# Patient Record
Sex: Male | Born: 1975 | Race: Black or African American | Hispanic: No | Smoking: Current every day smoker
Health system: Southern US, Community
[De-identification: ages and names within clinical notes are randomized; demographics above are authoritative.]

---

## 2014-01-18 ENCOUNTER — Encounter (HOSPITAL_COMMUNITY): Payer: Self-pay | Admitting: Emergency Medicine

## 2014-01-18 ENCOUNTER — Emergency Department (HOSPITAL_COMMUNITY)
Admission: EM | Admit: 2014-01-18 | Discharge: 2014-01-18 | Disposition: A | Discharge status: Home / Self Care | Payer: Self-pay | Attending: Emergency Medicine | Admitting: Emergency Medicine

## 2014-01-18 DIAGNOSIS — F17200 Nicotine dependence, unspecified, uncomplicated: Secondary | ICD-10-CM | POA: Insufficient documentation

## 2014-01-18 DIAGNOSIS — L03116 Cellulitis of left lower limb: Secondary | ICD-10-CM

## 2014-01-18 DIAGNOSIS — L03119 Cellulitis of unspecified part of limb: Principal | ICD-10-CM

## 2014-01-18 DIAGNOSIS — R21 Rash and other nonspecific skin eruption: Secondary | ICD-10-CM | POA: Insufficient documentation

## 2014-01-18 DIAGNOSIS — L02419 Cutaneous abscess of limb, unspecified: Secondary | ICD-10-CM | POA: Insufficient documentation

## 2014-01-18 DIAGNOSIS — L02416 Cutaneous abscess of left lower limb: Secondary | ICD-10-CM

## 2014-01-18 LAB — COMPREHENSIVE METABOLIC PANEL
ALK PHOS: 92 U/L (ref 39–117)
ALT: 19 U/L (ref 0–53)
ANION GAP: 11 (ref 5–15)
AST: 33 U/L (ref 0–37)
Albumin: 3.5 g/dL (ref 3.5–5.2)
BILIRUBIN TOTAL: 0.4 mg/dL (ref 0.3–1.2)
BUN: 11 mg/dL (ref 6–23)
CHLORIDE: 98 meq/L (ref 96–112)
CO2: 27 mEq/L (ref 19–32)
CREATININE: 0.75 mg/dL (ref 0.50–1.35)
Calcium: 9.2 mg/dL (ref 8.4–10.5)
GFR calc Af Amer: >90 mL/min (ref >90)
GFR calc non Af Amer: >90 mL/min (ref >90)
Glucose, Bld: 112 mg/dL — ABNORMAL HIGH (ref 70–99)
POTASSIUM: 5.2 meq/L (ref 3.7–5.3)
Sodium: 136 mEq/L — ABNORMAL LOW (ref 137–147)
Total Protein: 8.2 g/dL (ref 6.0–8.3)

## 2014-01-18 LAB — CBC WITH DIFFERENTIAL/PLATELET
BASOS ABS: 0 10*3/uL (ref 0.0–0.1)
Basophils Relative: 0 % (ref 0–1)
EOS ABS: 0 10*3/uL (ref 0.0–0.7)
Eosinophils Relative: 0 % (ref 0–5)
HCT: 42.7 % (ref 39.0–52.0)
Hemoglobin: 14.8 g/dL (ref 13.0–17.0)
Lymphocytes Relative: 33 % (ref 12–46)
Lymphs Abs: 2 10*3/uL (ref 0.7–4.0)
MCH: 30.2 pg (ref 26.0–34.0)
MCHC: 34.7 g/dL (ref 30.0–36.0)
MCV: 87.1 fL (ref 78.0–100.0)
Monocytes Absolute: 0.6 10*3/uL (ref 0.1–1.0)
Monocytes Relative: 10 % (ref 3–12)
NEUTROS ABS: 3.5 10*3/uL (ref 1.7–7.7)
Neutrophils Relative %: 57 % (ref 43–77)
PLATELETS: 325 10*3/uL (ref 150–400)
RBC: 4.9 MIL/uL (ref 4.22–5.81)
RDW: 13.1 % (ref 11.5–15.5)
WBC: 6.2 10*3/uL (ref 4.0–10.5)

## 2014-01-18 MED ORDER — OXYCODONE-ACETAMINOPHEN 5-325 MG PO TABS: 1.0000 | ORAL_TABLET | Freq: Four times a day (QID) | ORAL | Status: DC | PRN

## 2014-01-18 MED ORDER — LIDOCAINE-EPINEPHRINE 2 %-1:100000 IJ SOLN
10.0000 mL | Freq: Once | INTRAMUSCULAR | Status: DC
  Filled 2014-01-18: qty 10

## 2014-01-18 MED ORDER — CLINDAMYCIN PHOSPHATE 600 MG/50ML IV SOLN
600.0000 mg | Freq: Once | INTRAVENOUS | Status: AC
  Administered 2014-01-18: 600 mg via INTRAVENOUS
  Filled 2014-01-18: qty 50

## 2014-01-18 MED ORDER — MORPHINE SULFATE 4 MG/ML IJ SOLN
4.0000 mg | Freq: Once | INTRAMUSCULAR | Status: AC
  Administered 2014-01-18: 4 mg via INTRAVENOUS
  Filled 2014-01-18: qty 1

## 2014-01-18 MED ORDER — CLINDAMYCIN HCL 150 MG PO CAPS: 150.0000 mg | ORAL_CAPSULE | Freq: Four times a day (QID) | ORAL | Status: DC

## 2014-01-18 MED ORDER — LIDOCAINE-EPINEPHRINE (PF) 2 %-1:200000 IJ SOLN
10.0000 mL | Freq: Once | INTRAMUSCULAR | Status: AC
  Administered 2014-01-18: 10 mL via INTRADERMAL
  Filled 2014-01-18: qty 20

## 2014-01-18 NOTE — ED Notes (Signed)
Pt states he got a bite on his left thigh 3 days ago. It is red and oozing, has orange peel appearance around bite area. This reddened area is the size of a grape fruit. He states it is a 10/10 pain and he has pain with walking.

## 2014-01-18 NOTE — ED Provider Notes (Signed)
CSN: [id number]     Arrival date & time 01/18/14  1034 History   First MD Initiated Contact with Patient 01/18/14 1119     Chief Complaint  Patient presents with  . Insect Bite    red, appears to be cellulitis around bite     (Consider location/radiation/quality/duration/timing/severity/associated sxs/prior Treatment) HPI  38 year old male presents for evaluation of a infected insect bite. Patient reports 3 days ago he thought he may have been bitten by mosquitoes to his left inner thigh. Since then he noticed increasing pain, some drainage, and redness to the affected area with mild itchiness. Pain has been progressively worse, sharp, throbbing, worsening with ambulation. Pain is currently 10 out of 10. He takes over-the-counter medication such as ibuprofen with minimal relief. He denies fever nausea vomiting diarrhea, abdominal pain or back pain. No history of diabetes or other immunocompromise disease.  History reviewed. No pertinent past medical history. History reviewed. No pertinent past surgical history. History reviewed. No pertinent family history. History  Substance Use Topics  . Smoking status: Heavy Tobacco Smoker -- 0.50 packs/day    Types: Cigarettes  . Smokeless tobacco: Not on file  . Alcohol Use: Not on file    Review of Systems  Constitutional: Negative for fever.  Skin: Positive for rash.  Neurological: Negative for numbness.      Allergies  Review of patient's allergies indicates no known allergies.  Home Medications   Prior to Admission medications   Not on File   BP 131/76  Pulse 83  Temp(Src) 97.9 F (36.6 C) (Oral)  Resp 18  SpO2 96% Physical Exam  Constitutional: He appears well-developed and well-nourished. No distress.  HENT:  Head: Atraumatic.  Eyes: Conjunctivae are normal.  Neck: Normal range of motion. Neck supple.  Neurological: He is alert.  Skin: Rash (L inner thigh: area of induration/fluctuance moderate size with surrounding  erythema and warmth, actively oozing pus.  exquisitely tender to palpation.) noted.  Psychiatric: He has a normal mood and affect.    ED Course  Procedures (including critical care time)  11:33 AM Patient appears to have a large abscess with surrounding cellulitis of his left in the thigh of moderate size. He is afebrile stable normal vital signs but given the size of his infection, will check basic labs, and start IV clindamycin. Plan to I&D. Patient agrees with plan.  2:07 PM Wound has been I&D.  IV abx given.  Pain medication provided.  Cellulitis margin has been marked.  Pt to return in 2 days for wound recheck and packing removal.   INCISION AND DRAINAGE Performed by: Fayrene Helper Consent: Verbal consent obtained. Risks and benefits: risks, benefits and alternatives were discussed Type: abscess  Body area: L medial thigh  Anesthesia: local infiltration  Incision was made with a scalpel.  Local anesthetic: lidocaine 2% w epinephrine  Anesthetic total: 7 ml  Complexity: complex Blunt dissection to break up loculations  Drainage: purulent  Drainage amount: copious  Packing material: 1/4 in iodoform gauze  Patient tolerance: Patient tolerated the procedure well with no immediate complications.     Labs Review Labs Reviewed  COMPREHENSIVE METABOLIC PANEL - Abnormal; Notable for the following:    Sodium 136 (*)    Glucose, Bld 112 (*)    All other components within normal limits  CBC WITH DIFFERENTIAL    Imaging Review No results found.   EKG Interpretation None      MDM   Final diagnoses:  Abscess of left thigh  Cellulitis of left thigh    BP 131/76  Pulse 83  Temp(Src) 97.9 F (36.6 C) (Oral)  Resp 18  SpO2 96%  I have reviewed nursing notes and vital signs. I reviewed available ER/hospitalization records thought the EMR     Fayrene Helper, [place] 01/18/14 1407

## 2014-01-18 NOTE — Discharge Instructions (Signed)
Please continue to apply warm compress overlying the infection for the next several days. Return to urgent care call back to the ED for wound recheck in 2 days. Packing would need to be removed in 2 days. Take antibiotic and pain medication as prescribed. Follow instructions below. Return sooner if the condition worsened   Abscess Care After An abscess (also called a boil or furuncle) is an infected area that contains a collection of pus. Signs and symptoms of an abscess include pain, tenderness, redness, or hardness, or you may feel a moveable soft area under your skin. An abscess can occur anywhere in the body. The infection may spread to surrounding tissues causing cellulitis. A cut (incision) by the surgeon was made over your abscess and the pus was drained out. Gauze may have been packed into the space to provide a drain that will allow the cavity to heal from the inside outwards. The boil may be painful for 5 to 7 days. Most people with a boil do not have high fevers. Your abscess, if seen early, may not have localized, and may not have been lanced. If not, another appointment may be required for this if it does not get better on its own or with medications. HOME CARE INSTRUCTIONS   Only take over-the-counter or prescription medicines for pain, discomfort, or fever as directed by your caregiver.  When you bathe, soak and then remove gauze or iodoform packs at least daily or as directed by your caregiver. You may then wash the wound gently with mild soapy water. Repack with gauze or do as your caregiver directs. SEEK IMMEDIATE MEDICAL CARE IF:   You develop increased pain, swelling, redness, drainage, or bleeding in the wound site.  You develop signs of generalized infection including muscle aches, chills, fever, or a general ill feeling.  An oral temperature above 102 F (38.9 C) develops, not controlled by medication. See your caregiver for a recheck if you develop any of the symptoms  described above. If medications (antibiotics) were prescribed, take them as directed. Document Released: 11/07/2004 Document Revised: 07/14/2011 Document Reviewed: 07/05/2007 [facility] Patient Information 2015 [place], [place]. This information is not intended to replace advice given to you by your health care provider. Make sure you discuss any questions you have with your health care provider.  Cellulitis Cellulitis is an infection of the skin and the tissue under the skin. The infected area is usually red and tender. This happens most often in the arms and lower legs. HOME CARE   Take your antibiotic medicine as told. Finish the medicine even if you start to feel better.  Keep the infected arm or leg raised (elevated).  Put a warm cloth on the area up to 4 times per day.  Only take medicines as told by your doctor.  Keep all doctor visits as told. GET HELP IF:  You see red streaks on the skin coming from the infected area.  Your red area gets bigger or turns a dark color.  Your bone or joint under the infected area is painful after the skin heals.  Your infection comes back in the same area or different area.  You have a puffy (swollen) bump in the infected area.  You have new symptoms.  You have a fever. GET HELP RIGHT AWAY IF:   You feel very sleepy.  You throw up (vomit) or have watery poop (diarrhea).  You feel sick and have muscle aches and pains. MAKE SURE YOU:   Understand these  instructions.  Will watch your condition.  Will get help right away if you are not doing well or get worse. Document Released: 10/08/2007 Document Revised: 09/05/2013 Document Reviewed: 07/07/2011 [facility] Patient Information 2015 [place], [place]. This information is not intended to replace advice given to you by your health care provider. Make sure you discuss any questions you have with your health care provider.

## 2014-01-19 NOTE — ED Provider Notes (Signed)
Medical screening examination/treatment/procedure(s) were performed by non-physician practitioner and as supervising physician I was immediately available for consultation/collaboration.   EKG Interpretation None        Shon Baton, MD 01/19/14 0700

## 2014-10-03 ENCOUNTER — Emergency Department (HOSPITAL_COMMUNITY)
Admission: EM | Admit: 2014-10-03 | Discharge: 2014-10-03 | Disposition: A | Discharge status: Home / Self Care | Payer: Self-pay | Attending: Emergency Medicine | Admitting: Emergency Medicine

## 2014-10-03 ENCOUNTER — Encounter (HOSPITAL_COMMUNITY): Payer: Self-pay | Admitting: Emergency Medicine

## 2014-10-03 DIAGNOSIS — J029 Acute pharyngitis, unspecified: Secondary | ICD-10-CM | POA: Insufficient documentation

## 2014-10-03 DIAGNOSIS — J069 Acute upper respiratory infection, unspecified: Secondary | ICD-10-CM | POA: Insufficient documentation

## 2014-10-03 DIAGNOSIS — H9202 Otalgia, left ear: Secondary | ICD-10-CM | POA: Insufficient documentation

## 2014-10-03 DIAGNOSIS — B9789 Other viral agents as the cause of diseases classified elsewhere: Secondary | ICD-10-CM

## 2014-10-03 DIAGNOSIS — Z720 Tobacco use: Secondary | ICD-10-CM | POA: Insufficient documentation

## 2014-10-03 LAB — RAPID STREP SCREEN (MED CTR MEBANE ONLY): STREPTOCOCCUS, GROUP A SCREEN (DIRECT): NEGATIVE

## 2014-10-03 MED ORDER — PHENOL 1.4 % MT LIQD
1.0000 | OROMUCOSAL | Status: DC | PRN
  Administered 2014-10-03 (×2): 1 via OROMUCOSAL
  Filled 2014-10-03: qty 177

## 2014-10-03 NOTE — ED Notes (Signed)
Pt. reports sore throat with swelling onset this week with occasional dry cough and left ear ache , denies fever or chills, respirations unlabored/airway intact .

## 2014-10-03 NOTE — Discharge Instructions (Signed)
Continue to stay well-hydrated. Gargle warm salt water and spit it out. Use chloraseptic spray as needed for pain. Continue to alternate between Tylenol and Ibuprofen for pain or fever. Use Mucinex for cough suppression/expectoration of mucus. May consider over-the-counter Benadryl or other antihistamine to decrease secretions and for watery itchy eyes. Followup with [facility] in 5-7 days for recheck of ongoing symptoms and to establish primary care. Return to emergency department for emergent changing or worsening of symptoms.   Pharyngitis Pharyngitis is a sore throat (pharynx). There is redness, pain, and swelling of your throat. HOME CARE   Drink enough fluids to keep your pee (urine) clear or pale yellow.  Only take medicine as told by your doctor.  You may get sick again if you do not take medicine as told. Finish your medicines, even if you start to feel better.  Do not take aspirin.  Rest.  Rinse your mouth (gargle) with salt water ( tsp of salt per 1 qt of water) every 1-2 hours. This will help the pain.  If you are not at risk for choking, you can suck on hard candy or sore throat lozenges. GET HELP IF:  You have large, tender lumps on your neck.  You have a rash.  You cough up green, yellow-brown, or bloody spit. GET HELP RIGHT AWAY IF:   You have a stiff neck.  You drool or cannot swallow liquids.  You throw up (vomit) or are not able to keep medicine or liquids down.  You have very bad pain that does not go away with medicine.  You have problems breathing (not from a stuffy nose). MAKE SURE YOU:   Understand these instructions.  Will watch your condition.  Will get help right away if you are not doing well or get worse. Document Released: 10/08/2007 Document Revised: 02/09/2013 Document Reviewed: 12/27/2012 [facility] Patient Information 2015 [place], [place]. This information is not intended to replace advice given to you by your health  care provider. Make sure you discuss any questions you have with your health care provider.  Salt Water Gargle This solution will help make your mouth and throat feel better. HOME CARE INSTRUCTIONS   Mix 1 teaspoon of salt in 8 ounces of warm water.  Gargle with this solution as much or often as you need or as directed. Swish and gargle gently if you have any sores or wounds in your mouth.  Do not swallow this mixture. Document Released: 01/24/2004 Document Revised: 07/14/2011 Document Reviewed: 06/16/2008 [facility] Patient Information 2015 [place], [place]. This information is not intended to replace advice given to you by your health care provider. Make sure you discuss any questions you have with your health care provider.  Upper Respiratory Infection, Adult An upper respiratory infection (URI) is also sometimes known as the common cold. The upper respiratory tract includes the nose, sinuses, throat, trachea, and bronchi. Bronchi are the airways leading to the lungs. Most people improve within 1 week, but symptoms can last up to 2 weeks. A residual cough may last even longer.  CAUSES Many different viruses can infect the tissues lining the upper respiratory tract. The tissues become irritated and inflamed and often become very moist. Mucus production is also common. A cold is contagious. You can easily spread the virus to others by oral contact. This includes kissing, sharing a glass, coughing, or sneezing. Touching your mouth or nose and then touching a surface, which is then touched by another person, can also spread  the virus. SYMPTOMS  Symptoms typically develop 1 to 3 days after you come in contact with a cold virus. Symptoms vary from person to person. They may include:  Runny nose.  Sneezing.  Nasal congestion.  Sinus irritation.  Sore throat.  Loss of voice (laryngitis).  Cough.  Fatigue.  Muscle aches.  Loss of appetite.  Headache.  Low-grade fever. DIAGNOSIS    You might diagnose your own cold based on familiar symptoms, since most people get a cold 2 to 3 times a year. Your caregiver can confirm this based on your exam. Most importantly, your caregiver can check that your symptoms are not due to another disease such as strep throat, sinusitis, pneumonia, asthma, or epiglottitis. Blood tests, throat tests, and X-rays are not necessary to diagnose a common cold, but they may sometimes be helpful in excluding other more serious diseases. Your caregiver will decide if any further tests are required. RISKS AND COMPLICATIONS  You may be at risk for a more severe case of the common cold if you smoke cigarettes, have chronic heart disease (such as heart failure) or lung disease (such as asthma), or if you have a weakened immune system. The very young and very old are also at risk for more serious infections. Bacterial sinusitis, middle ear infections, and bacterial pneumonia can complicate the common cold. The common cold can worsen asthma and chronic obstructive pulmonary disease (COPD). Sometimes, these complications can require emergency medical care and may be life-threatening. PREVENTION  The best way to protect against getting a cold is to practice good hygiene. Avoid oral or hand contact with people with cold symptoms. Wash your hands often if contact occurs. There is no clear evidence that vitamin C, vitamin E, echinacea, or exercise reduces the chance of developing a cold. However, it is always recommended to get plenty of rest and practice good nutrition. TREATMENT  Treatment is directed at relieving symptoms. There is no cure. Antibiotics are not effective, because the infection is caused by a virus, not by bacteria. Treatment may include:  Increased fluid intake. Sports drinks offer valuable electrolytes, sugars, and fluids.  Breathing heated mist or steam (vaporizer or shower).  Eating chicken soup or other clear broths, and maintaining good  nutrition.  Getting plenty of rest.  Using gargles or lozenges for comfort.  Controlling fevers with ibuprofen or acetaminophen as directed by your caregiver.  Increasing usage of your inhaler if you have asthma. Zinc gel and zinc lozenges, taken in the first 24 hours of the common cold, can shorten the duration and lessen the severity of symptoms. Pain medicines may help with fever, muscle aches, and throat pain. A variety of non-prescription medicines are available to treat congestion and runny nose. Your caregiver can make recommendations and may suggest nasal or lung inhalers for other symptoms.  HOME CARE INSTRUCTIONS   Only take over-the-counter or prescription medicines for pain, discomfort, or fever as directed by your caregiver.  Use a warm mist humidifier or inhale steam from a shower to increase air moisture. This may keep secretions moist and make it easier to breathe.  Drink enough water and fluids to keep your urine clear or pale yellow.  Rest as needed.  Return to work when your temperature has returned to normal or as your caregiver advises. You may need to stay home longer to avoid infecting others. You can also use a face mask and careful hand washing to prevent spread of the virus. SEEK MEDICAL CARE IF:  After the first few days, you feel you are getting worse rather than better.  You need your caregiver's advice about medicines to control symptoms.  You develop chills, worsening shortness of breath, or brown or red sputum. These may be signs of pneumonia.  You develop yellow or brown nasal discharge or pain in the face, especially when you bend forward. These may be signs of sinusitis.  You develop a fever, swollen neck glands, pain with swallowing, or white areas in the back of your throat. These may be signs of strep throat. SEEK IMMEDIATE MEDICAL CARE IF:   You have a fever.  You develop severe or persistent headache, ear pain, sinus pain, or chest  pain.  You develop wheezing, a prolonged cough, cough up blood, or have a change in your usual mucus (if you have chronic lung disease).  You develop sore muscles or a stiff neck. Document Released: 10/15/2000 Document Revised: 07/14/2011 Document Reviewed: 07/27/2013 [facility] Patient Information 2015 [place], [place]. This information is not intended to replace advice given to you by your health care provider. Make sure you discuss any questions you have with your health care provider.

## 2014-10-03 NOTE — ED Notes (Signed)
Pt st's he has had a sore throat x's 2 days.  St's feels like it did when he had strep before

## 2014-10-03 NOTE — ED Provider Notes (Signed)
CSN: [id number]     Arrival date & time 10/03/14  1846 History  This chart was scribed for non-physician practitioner, Allen DerryMercedes Camprubi-Soms, PA-C, working with Gilda Creasehristopher J Pollina, MD, by Ronney LionSuzanne Le, ED Scribe. This patient was seen in room TR05C/TR05C and the patient's care was started at 8:02 PM.     Chief Complaint  Patient presents with  . Sore Throat   Patient is a 39 y.o. male presenting with pharyngitis. The history is provided by the patient. No language interpreter was used.  Sore Throat This is a recurrent problem. The current episode started more than 2 days ago. The problem occurs constantly. The problem has been gradually worsening. Pertinent negatives include no chest pain, no abdominal pain, no headaches and no shortness of breath. The symptoms are aggravated by swallowing. The symptoms are relieved by acetaminophen. He has tried acetaminophen for the symptoms. The treatment provided mild relief.     HPI Comments: Jesse Pugh is a 39 y.o. male who presents to the Emergency Department complaining of a constant, 10/10, worsening, scratchy sore throat radiating to his left ear that began 3 days ago. He had taken Tylenol, which reduced the severity to a level of an 8/10. Swallowing exacerbates the pain. He endorses a dry cough. Patient has had sick contact with a coworker at work. He also states he had a similar sore throat in the past which turned out to be strep throat and treated with penicillin. He denies seasonal allergies or any recent travel. He denies rhinorrhea, trismus, drooling, ear discharge, fever, chills, eye itching, eye drainage, visual changes, chest pain, SOB, abdominal pain, nausea, vomiting, numbness, weakness, tingling, or hearing loss.   History reviewed. No pertinent past medical history. History reviewed. No pertinent past surgical history. No family history on file. History  Substance Use Topics  . Smoking status: Heavy Tobacco Smoker -- 0.50 packs/day     Types: Cigarettes  . Smokeless tobacco: Not on file  . Alcohol Use: Yes    Review of Systems  Constitutional: Negative for fever and chills.  HENT: Positive for ear pain and sore throat. Negative for drooling, ear discharge, hearing loss, rhinorrhea, sinus pressure, tinnitus and trouble swallowing.   Eyes: Negative for discharge, itching and visual disturbance.  Respiratory: Positive for cough (dry). Negative for shortness of breath and wheezing.   Cardiovascular: Negative for chest pain.  Gastrointestinal: Negative for nausea, vomiting, abdominal pain and diarrhea.  Genitourinary: Negative for dysuria and hematuria.  Musculoskeletal: Negative for myalgias and arthralgias.  Skin: Negative for rash.  Allergic/Immunologic: Negative for environmental allergies and immunocompromised state.  Neurological: Negative for weakness, numbness and headaches.  Psychiatric/Behavioral: Negative for confusion.  A complete 10 system review of systems was obtained and all systems are negative except as noted in the HPI and PMH.    Allergies  Review of patient's allergies indicates no known allergies.  Home Medications   Prior to Admission medications   Medication Sig Start Date End Date Taking? Authorizing Provider  clindamycin (CLEOCIN) 150 MG capsule Take 1 capsule (150 mg total) by mouth every 6 (six) hours. 01/18/14   Fayrene HelperBowie Tran, PA-C  oxyCODONE-acetaminophen (PERCOCET/ROXICET) 5-325 MG per tablet Take 1 tablet by mouth every 6 (six) hours as needed for severe pain. 01/18/14   Fayrene HelperBowie Tran, PA-C   BP 140/99 mmHg  Pulse 64  Temp(Src) 97.9 F (36.6 C) (Oral)  Resp 22  Ht 6\' 2"  (1.88 m)  Wt 269 lb (122.018 kg)  BMI 34.52 kg/m2  SpO2  99% Physical Exam  Constitutional: He is oriented to person, place, and time. Vital signs are normal. He appears well-developed and well-nourished.  Non-toxic appearance. No distress.  Afebrile, nontoxic, NAD  HENT:  Head: Normocephalic and atraumatic.  Right  Ear: Hearing, tympanic membrane, external ear and ear canal normal.  Left Ear: Hearing, tympanic membrane, external ear and ear canal normal.  Nose: Nose normal. No mucosal edema or rhinorrhea.  Mouth/Throat: Uvula is midline and mucous membranes are normal. No trismus in the jaw. No uvula swelling. Posterior oropharyngeal erythema present. No oropharyngeal exudate, posterior oropharyngeal edema or tonsillar abscesses.  Ears are clear bilaterally. Nose clear. Oropharynx clear and moist, with mild erythema diffusely to posterior oropharynx, without uvular swelling or deviation, no trismus or drooling, no tonsillar swelling or exudates.    Eyes: Conjunctivae and EOM are normal. Right eye exhibits no discharge. Left eye exhibits no discharge.  Neck: Normal range of motion. Neck supple.  Cardiovascular: Normal rate, regular rhythm, normal heart sounds and intact distal pulses.  Exam reveals no gallop and no friction rub.   No murmur heard. Pulmonary/Chest: Effort normal and breath sounds normal. No respiratory distress. He has no decreased breath sounds. He has no wheezes. He has no rhonchi. He has no rales.  CTAB in all lung fields, no w/r/r, no hypoxia or increased WOB, speaking in full sentences, SpO2 99% on RA   Abdominal: Normal appearance. He exhibits no distension.  Musculoskeletal: Normal range of motion.  Lymphadenopathy:       Head (right side): Tonsillar adenopathy present. No submandibular adenopathy present.       Head (left side): Tonsillar adenopathy present. No submandibular adenopathy present.    He has no cervical adenopathy.  B/l tonsillar LAD which is mildly TTP, no other head/neck LAD  Neurological: He is alert and oriented to person, place, and time. He has normal strength. No sensory deficit.  Skin: Skin is warm, dry and intact. No rash noted.  Psychiatric: He has a normal mood and affect.  Nursing note and vitals reviewed.   ED Course  Procedures (including critical care  time)  DIAGNOSTIC STUDIES: Oxygen Saturation is 99% on RA, normal by my interpretation.    COORDINATION OF CARE: 8:09 PM - Discussed treatment plan with pt at bedside which includes chloraseptic spray, Tylenol, Motrin, fluids, and rest. Pt agreed to plan.   Labs Review Labs Reviewed  RAPID STREP SCREEN (NOT AT [facility])  CULTURE, GROUP A STREP    MDM   Final diagnoses:  Pharyngitis  Viral URI with cough  Referred otalgia of left ear    39 y.o. male here with sore throat and L otalgia x3 days. Oropharynx mildly erythematous without tonsillar swelling or exudates, CENTOR criteria low, RST neg therefore will not treat and will await culture results. Clear lung exam. Likely viral etiology. Will give chloraseptic spray. Discussed symptomatic control. Will have him f/up with [facility] in 1wk for recheck and to establish medical care. I explained the diagnosis and have given explicit precautions to return to the ER including for any other new or worsening symptoms. The patient understands and accepts the medical plan as it's been dictated and I have answered their questions. Discharge instructions concerning home care and prescriptions have been given. The patient is STABLE and is discharged to home in good condition.   I personally performed the services described in this documentation, which was scribed in my presence. The recorded information has been reviewed and is accurate.  BP 140/99  mmHg  Pulse 64  Temp(Src) 97.9 F (36.6 C) (Oral)  Resp 22  Ht  (1.88 m)  Wt 269 lb (122.018 kg)  BMI 34.52 kg/m2  SpO2 99%  Meds ordered this encounter  Medications  . phenol (CHLORASEPTIC) mouth spray 1 spray    Sig:      Jazman Reuter Camprubi-Soms, PA-C 10/03/14 2025  Gilda Crease, MD 10/07/14 1021

## 2014-10-06 LAB — CULTURE, GROUP A STREP

## 2015-03-10 ENCOUNTER — Encounter (HOSPITAL_COMMUNITY): Payer: Self-pay | Admitting: *Deleted

## 2015-03-10 ENCOUNTER — Emergency Department (HOSPITAL_COMMUNITY)
Admission: EM | Admit: 2015-03-10 | Discharge: 2015-03-10 | Disposition: A | Discharge status: Home / Self Care | Payer: Self-pay | Attending: Emergency Medicine | Admitting: Emergency Medicine

## 2015-03-10 DIAGNOSIS — J069 Acute upper respiratory infection, unspecified: Secondary | ICD-10-CM | POA: Insufficient documentation

## 2015-03-10 DIAGNOSIS — Y9289 Other specified places as the place of occurrence of the external cause: Secondary | ICD-10-CM | POA: Insufficient documentation

## 2015-03-10 DIAGNOSIS — R0981 Nasal congestion: Secondary | ICD-10-CM | POA: Insufficient documentation

## 2015-03-10 DIAGNOSIS — Y9389 Activity, other specified: Secondary | ICD-10-CM | POA: Insufficient documentation

## 2015-03-10 DIAGNOSIS — W541XXA Struck by dog, initial encounter: Secondary | ICD-10-CM | POA: Insufficient documentation

## 2015-03-10 DIAGNOSIS — S0033XA Contusion of nose, initial encounter: Secondary | ICD-10-CM | POA: Insufficient documentation

## 2015-03-10 DIAGNOSIS — Y998 Other external cause status: Secondary | ICD-10-CM | POA: Insufficient documentation

## 2015-03-10 DIAGNOSIS — J3489 Other specified disorders of nose and nasal sinuses: Secondary | ICD-10-CM | POA: Insufficient documentation

## 2015-03-10 DIAGNOSIS — Z720 Tobacco use: Secondary | ICD-10-CM | POA: Insufficient documentation

## 2015-03-10 MED ORDER — KETOROLAC TROMETHAMINE 10 MG PO TABS
10.0000 mg | ORAL_TABLET | Freq: Once | ORAL | Status: DC
  Filled 2015-03-10: qty 1

## 2015-03-10 MED ORDER — IBUPROFEN 800 MG PO TABS: 800.0000 mg | ORAL_TABLET | Freq: Three times a day (TID) | ORAL | Status: DC

## 2015-03-10 MED ORDER — KETOROLAC TROMETHAMINE 60 MG/2ML IM SOLN
60.0000 mg | Freq: Once | INTRAMUSCULAR | Status: AC
  Administered 2015-03-10: 60 mg via INTRAMUSCULAR
  Filled 2015-03-10: qty 2

## 2015-03-10 NOTE — Discharge Instructions (Signed)
1. Medications: alternate ibuprofen and tylenol for pain control, usual home medications 2. Treatment: rest, ice,  drink plenty of fluids,  3. Follow Up: Please followup with orthopedics as directed or your PCP in 1 week if no improvement for discussion of your diagnoses and further evaluation after today's visit; if you do not have a primary care doctor use the resource guide provided to find one; Please return to the ER for worsening symptoms or other concerns     Cryotherapy Cryotherapy means treatment with cold. Ice or gel packs can be used to reduce both pain and swelling. Ice is the most helpful within the first 24 to 48 hours after an injury or flare-up from overusing a muscle or joint. Sprains, strains, spasms, burning pain, shooting pain, and aches can all be eased with ice. Ice can also be used when recovering from surgery. Ice is effective, has very few side effects, and is safe for most people to use. PRECAUTIONS  Ice is not a safe treatment option for people with:  Raynaud phenomenon. This is a condition affecting small blood vessels in the extremities. Exposure to cold may cause your problems to return.  Cold hypersensitivity. There are many forms of cold hypersensitivity, including:  Cold urticaria. Red, itchy hives appear on the skin when the tissues begin to warm after being iced.  Cold erythema. This is a red, itchy rash caused by exposure to cold.  Cold hemoglobinuria. Red blood cells break down when the tissues begin to warm after being iced. The hemoglobin that carry oxygen are passed into the urine because they cannot combine with blood proteins fast enough.  Numbness or altered sensitivity in the area being iced. If you have any of the following conditions, do not use ice until you have discussed cryotherapy with your caregiver:  Heart conditions, such as arrhythmia, angina, or chronic heart disease.  High blood pressure.  Healing wounds or open skin in the area  being iced.  Current infections.  Rheumatoid arthritis.  Poor circulation.  Diabetes. Ice slows the blood flow in the region it is applied. This is beneficial when trying to stop inflamed tissues from spreading irritating chemicals to surrounding tissues. However, if you expose your skin to cold temperatures for too long or without the proper protection, you can damage your skin or nerves. Watch for signs of skin damage due to cold. HOME CARE INSTRUCTIONS Follow these tips to use ice and cold packs safely.  Place a dry or damp towel between the ice and skin. A damp towel will cool the skin more quickly, so you may need to shorten the time that the ice is used.  For a more rapid response, add gentle compression to the ice.  Ice for no more than 10 to 20 minutes at a time. The bonier the area you are icing, the less time it will take to get the benefits of ice.  Check your skin after 5 minutes to make sure there are no signs of a poor response to cold or skin damage.  Rest 20 minutes or more between uses.  Once your skin is numb, you can end your treatment. You can test numbness by very lightly touching your skin. The touch should be so light that you do not see the skin dimple from the pressure of your fingertip. When using ice, most people will feel these normal sensations in this order: cold, burning, aching, and numbness.  Do not use ice on someone who cannot communicate their  responses to pain, such as small children or people with dementia. HOW TO MAKE AN ICE PACK Ice packs are the most common way to use ice therapy. Other methods include ice massage, ice baths, and cryosprays. Muscle creams that cause a cold, tingly feeling do not offer the same benefits that ice offers and should not be used as a substitute unless recommended by your caregiver. To make an ice pack, do one of the following:  Place crushed ice or a bag of frozen vegetables in a sealable plastic bag. Squeeze out the  excess air. Place this bag inside another plastic bag. Slide the bag into a pillowcase or place a damp towel between your skin and the bag.  Mix 3 parts water with 1 part rubbing alcohol. Freeze the mixture in a sealable plastic bag. When you remove the mixture from the freezer, it will be slushy. Squeeze out the excess air. Place this bag inside another plastic bag. Slide the bag into a pillowcase or place a damp towel between your skin and the bag. SEEK MEDICAL CARE IF:  You develop white spots on your skin. This may give the skin a blotchy (mottled) appearance.  Your skin turns blue or pale.  Your skin becomes waxy or hard.  Your swelling gets worse. MAKE SURE YOU:   Understand these instructions.  Will watch your condition.  Will get help right away if you are not doing well or get worse.   This information is not intended to replace advice given to you by your health care provider. Make sure you discuss any questions you have with your health care provider.   Document Released: 12/16/2010 Document Revised: 05/12/2014 Document Reviewed: 12/16/2010 Elsevier Interactive Patient Education [employer]    Emergency Department Resource Guide 1) Find a Doctor and Pay Out of Pocket Although you won't have to find out who is covered by your insurance plan, it is a good idea to ask around and get recommendations. You will then need to call the office and see if the doctor you have chosen will accept you as a new patient and what types of options they offer for patients who are self-pay. Some doctors offer discounts or will set up payment plans for their patients who do not have insurance, but you will need to ask so you aren't surprised when you get to your appointment.  2) Contact Your Local Health Department Not all health departments have doctors that can see patients for sick visits, but many do, so it is worth a call to see if yours does. If you don't know where your local  health department is, you can check in your phone book. The CDC also has a tool to help you locate your state's health department, and many state websites also have listings of all of their local health departments.  3) Find a Walk-in Clinic If your illness is not likely to be very severe or complicated, you may want to try a walk in clinic. These are popping up all over the country in pharmacies, drugstores, and shopping centers. They're usually staffed by nurse practitioners or physician assistants that have been trained to treat common illnesses and complaints. They're usually fairly quick and inexpensive. However, if you have serious medical issues or chronic medical problems, these are probably not your best option.  No Primary Care Doctor: - Call Health Connect at  [phone] - they can help you locate a primary care doctor that  accepts your insurance, provides  certain services, etc. - Physician Referral Service- [phone]  Chronic Pain Problems: Organization         Address  Phone   Notes  [facility]  [phone] Patients need to be referred by their primary care doctor.   Medication Assistance: Organization         Address  Phone   Notes  [facility] [address] [place], [place] [address] [phone] --Must be a resident of [facility] [place] -- Must have NO insurance coverage whatsoever (no Medicaid/ Medicare, etc.) -- The pt. MUST have a primary care doctor that directs their care regularly and follows them in the community   Med[facility]ist  [phone]) [phone]   [employer]  [phone]    Agencies that provide inexpensive medical care: Organization         Address  Phone   Notes  [facility]  [phone]) [phone]   [facility]    [phone]) [phone]   [facility] [address] [place], [place] [address] [phone]   [facility] [address], [place] [phone]) [phone]   [facility]anned Parenthood    [phone]   [facility]    [phone]) [phone]   Community Health and [facility]  [address], [place] Phone:  [phone]) [phone], Fax:  [phone]) [phone] Hours of Operation:  9 am - 6 pm, M-F.  Also accepts Medicaid/Medicare and self-pay.  [facility]  [address], [place] Phone: [phone](336) [phone], Fax: [phone]. Hours of Operation:  8:30 am - 5:30 pm, M-F.  Also accepts Medicaid and self-pay.  [facility] [place] [address], [place] Phone: [phone]) [phone]   [facility] [address], [place], [place] [phone], Ext. [phone] Mondays & Thursdays: 7-9 AM.  First 15 patients are seen on a first come, first serve basis.    Medicaid-accepting [facility] Providers:  Organization         Address  Phone   Notes  [facility] [address], [place] [phone] Also accepts self-pay patients.  [facility] [address] [place], [place]  [phone]   [facility] [address], [place] [phone]   [facility] [address] [place] [address], [place] [phone]) [phone]   Renaye RakersVeita Bland [address], [place]   [phone] Only accepts [place] Access [place] patients after they have their name applied to their card.   Self-Pay (no insurance) in [facility]:  Organization         Address  Phone   Notes  Sickle Cell Patients, [facility] [address], [place] [phone]) [phone]   [facility] [address], [place] [phone]) [phone]   [facility] [place]  [address], [place] [phone]) [phone]   [facility]/Dr. Osei-Bonsu  [address] [place] [address], [place] or [phone], [place] [phone] Phone number for both [place] and  [place] locations is the same.  [facility] [address], [place] [phone]   [facility] [place] [address] [place] [address], [place] or [address] [phone]   [facility], [facility] [address], [place] [phone], phone; [phone], fax Sees patients 1st and 3rd Saturday of every month.  Must not qualify for public or private insurance (i.e. Medicaid, Medicare, [place] Health Choice, Veterans' Benefits)  Household income should be no more than 200% of the poverty level The clinic cannot treat you if you are pregnant or think you are pregnant  Sexually transmitted diseases are not treated at the clinic.    Dental Care: Organization         Address  Phone  Notes  [facility] [address] [place], [place] [phone] Accepts children up to age 51 who are enrolled in [place] or [place] Health Choice; pregnant women with a Medicaid card; and children who have applied for Medicaid or [place] Health Choice, but were declined, whose parents can pay a reduced fee at time of service.  [facility] of [facility]  [address], [place] [phone] Accepts children up to age 21 who are enrolled in [place] or [place] Health Choice; pregnant women with a Medicaid card; and children who have applied for Medicaid or [place] Health Choice, but were declined, whose parents can pay a reduced fee at time of service.  [facility]  [address] [place], [place] [phone] Patients are seen by appointment only. Walk-ins are not accepted. [facility] will see patients 56 years of age and older. Monday - Tuesday (8am-5pm) Most Wednesdays (8:30-5pm) $30 per visit, cash only  [facility] Adult Dental Access PROGRAM  [address], [facility] [phone] Patients are seen by appointment only. Walk-ins are not accepted.  [facility] will see patients 51 years of age and older. One Wednesday Evening (Monthly: Volunteer Based).  $30 per visit, cash only  [employer]  [phone] for adults; Children under age 13, call Graduate Pediatric Dentistry at [phone]. Children aged 43-14, please call [phone] to request a pediatric application.  Dental services are provided in all areas of dental care including fillings, crowns and bridges, complete and partial dentures, implants, gum treatment, root canals, and extractions. Preventive care is also provided. Treatment is provided to both adults and children. Patients are selected via a lottery and there is often a waiting list.   [facility] [address], [place]  [phone] [email or web address]   [facility] [address] [place], [place] [phone], Ext. [phone] Second and Fourth Thursday of each month, opens at 6:30 AM; Clinic ends at 9 AM.  Patients are seen on a first-come first-served basis, and a limited number are seen during each clinic.   [facility]  [address] [place], [place] [phone]   Eligibility Requirements You must have lived in [place], [place], or [place] counties for at least the last three months.   You cannot be eligible for state or federal sponsored [place], including [employer], [place], or [employer].   You generally cannot be eligible for healthcare insurance through your employer.    How to apply: Eligibility screenings are held every Tuesday and Wednesday afternoon from 1:00 pm until 4:00 pm. You do not need an appointment for the interview!  [facility] [address], [place], [place] [phone]   [facility]  [phone]   [facility]  [phone]   [facility]  [phone]    Behavioral  Health Resources in the  Community: Intensive Outpatient Programs Organization         Address  Phone  Notes  [facility] [address] [place], [place] [phone]   [facility]patient [address] [place], [place] [phone]   [facility]: [facility] Drug [facility] [address], [place], [place]  [phone]   [facility] Mental Health [address],  [place], [place] [phone] or [phone]   Substance Abuse Resources Organization         Address  Phone  Notes  Alcohol [facility] Drug Services  [phone]   [facility]  [phone]   [facility] [place]  [phone]   Floydene Flock  [phone]   Residential & Outpatient Substance Abuse Program  [phone]   Psychological Services Organization         Address  Phone  Notes  [facility]  [phone]   [facility]  [phone]   [facility] Mental Health [address] [place] [phone] or [phone]    Mobile Crisis Teams Organization         Address  Phone  Notes  [facility], Mobile Crisis Care Unit  [phone]   [facility]  [address] [place], [place] [phone]   Doristine Locks [address] [place] [phone]    Self-Help/Support Groups Organization         Address  Phone             Notes  [facility] [place] - variety of support groups  336- I7437963 Call for more information  Narcotics Anonymous (NA), [facility] [address], [employer] [place]  2 meetings at this location   Statistician         Address  Phone  Notes  [facility] [address],    [place]  [phone]   [facility]  [address] [place] [address], [place], [place] [phone]   [facility] Treatment [facility] [address] [place], [place] [phone] Admissions: 8am-3pm M-F  [facility] [address],    [place], [place] [phone]   [facility] [address] [place], [place], [place] [phone]   [facility] [address]  [place], [place] [phone]   [facility] - Intensive Outpatient [address], [place], [place] [phone]   [facility] ([facility] [address]  [place], [place] [phone] or [phone]   [facility] ([facility]) [address], [place], [place] [phone] Accepts Medicaid  [facility] [address],  [place] [phone] Substance Abuse/Addiction Treatment   [facility] Organization         Address  Phone  Notes  [facility]  [phone]   Angie Fava, PhD [address] [place], [place]   [phone] or [phone]   [facility] Behavioral   [address] [place], [place] [phone]   [facility] [address] [place], [place] [phone] Insurance/Medicaid/sponsorship through [employer] and Families [address]                                    [place], [place] [phone] Therapy/tele-psych/case  [facility] [address]   [place],  [place] [phone]    °Dr. Arfeen  [phone]   °[facility] [place]  [employer] [place] [facility] 1) [address], [place] °2) [address], [place] °3)  [address], [place] [phone]) [phone] °([phone]) [phone] ° °([phone]) [phone]   °[place] Child Abuse Hotline [phone]) [phone] or ([phone]) [phone] (After Hours)    ° ° ° °

## 2015-03-10 NOTE — ED Notes (Signed)
Pt reports dog running into his nose on Thursday, having pain and swelling since.

## 2015-03-10 NOTE — ED Provider Notes (Signed)
CSN: [id number]     Arrival date & time 03/10/15  1126 History  By signing my name below, I, Placido Sou, attest that this documentation has been prepared under the direction and in the presence of [employer], PA-C. Electronically Signed: Placido Sou, ED Scribe. 03/10/2015. 1:15 PM.   Chief Complaint  Patient presents with  . Facial Injury    The history is provided by the patient, the spouse and medical records. No language interpreter was used.    HPI Comments: Jesse Pugh is a 39 y.o. male who presents to the Emergency Department complaining of mild pain and swelling to his nose and upper lip with onset 2 days ago. Pt notes that his dog ran into his nose resulting in his current symptoms. He notes applying ice to the affected area and taking 2 tablets of ibuprofen which has provided mild relief of his pain. He denies LOC, epistaxis, facial pain and dental pain.   Pt also notes multiple cold-like symptoms including sinus congestion with onset 1 week ago. He notes taking OTC tylenol cold as needed which has provided mild relief further noting his symptoms have been gradually subsiding since onset. He denies any other associated symptoms at this time.  Denies fever, chills, lateralizing sinus congestion, vision changes, dental pain.  History reviewed. No pertinent past medical history. History reviewed. No pertinent past surgical history. History reviewed. No pertinent family history. Social History  Substance Use Topics  . Smoking status: Heavy Tobacco Smoker -- 0.50 packs/day    Types: Cigarettes  . Smokeless tobacco: None  . Alcohol Use: Yes    Review of Systems  Constitutional: Negative for fever, chills, appetite change and fatigue.  HENT: Positive for congestion, facial swelling ( nose), rhinorrhea and sinus pressure. Negative for dental problem, ear discharge, ear pain, mouth sores, nosebleeds, postnasal drip and sore throat.   Eyes: Negative for visual  disturbance.  Respiratory: Negative for cough, chest tightness, shortness of breath, wheezing and stridor.   Cardiovascular: Negative for chest pain, palpitations and leg swelling.  Gastrointestinal: Negative for nausea, vomiting, abdominal pain and diarrhea.  Genitourinary: Negative for dysuria, urgency, frequency and hematuria.  Musculoskeletal: Negative for myalgias, back pain, arthralgias and neck stiffness.  Skin: Negative for rash.  Neurological: Negative for syncope, light-headedness, numbness and headaches.  Hematological: Negative for adenopathy.  Psychiatric/Behavioral: The patient is not nervous/anxious.   All other systems reviewed and are negative.  Allergies  Review of patient's allergies indicates no known allergies.  Home Medications   Prior to Admission medications   Medication Sig Start Date End Date Taking? Authorizing Provider  clindamycin (CLEOCIN) 150 MG capsule Take 1 capsule (150 mg total) by mouth every 6 (six) hours. 01/18/14   Fayrene Helper, PA-C  ibuprofen (ADVIL,MOTRIN) 800 MG tablet Take 1 tablet (800 mg total) by mouth 3 (three) times daily. 03/10/15   Yaqub Arney, PA-C  oxyCODONE-acetaminophen (PERCOCET/ROXICET) 5-325 MG per tablet Take 1 tablet by mouth every 6 (six) hours as needed for severe pain. 01/18/14   Fayrene Helper, PA-C   BP 147/96 mmHg  Pulse 73  Temp(Src) 98.2 F (36.8 C) (Oral)  Resp 16  Ht  (1.854 m)  Wt 261 lb 6.4 oz (118.57 kg)  BMI 34.49 kg/m2  SpO2 98% Physical Exam  Constitutional: He is oriented to person, place, and time. He appears well-developed and well-nourished. No distress.  HENT:  Head: Normocephalic and atraumatic.  Right Ear: Tympanic membrane, external ear and ear canal normal.  Left Ear:  Tympanic membrane, external ear and ear canal normal.  Nose: Mucosal edema and rhinorrhea present. No epistaxis. Right sinus exhibits no maxillary sinus tenderness and no frontal sinus tenderness. Left sinus exhibits no  maxillary sinus tenderness and no frontal sinus tenderness.  Mouth/Throat: Uvula is midline, oropharynx is clear and moist and mucous membranes are normal. Mucous membranes are not pale and not cyanotic. No oropharyngeal exudate, posterior oropharyngeal edema, posterior oropharyngeal erythema or tonsillar abscesses.  Mild swelling noted to the tip of the nose extending into the soft tissue laterally and inferiorly into the upper lip; no erythema, induration, ecchymosis or increased warmth Dentition intact; no loose teeth or TTP of the gumline No TTP of the bridge of the nose; no palpable deformity  Eyes: Conjunctivae are normal. Pupils are equal, round, and reactive to light.  Neck: Normal range of motion and full passive range of motion without pain.  Cardiovascular: Normal rate and intact distal pulses.   Pulmonary/Chest: Effort normal and breath sounds normal. No stridor.  Clear and equal breath sounds without focal wheezes, rhonchi, rales  Abdominal: Soft. Bowel sounds are normal. There is no tenderness.  Musculoskeletal: Normal range of motion.  Lymphadenopathy:    He has no cervical adenopathy.  Neurological: He is alert and oriented to person, place, and time.  Skin: Skin is warm and dry. No rash noted. He is not diaphoretic.  Psychiatric: He has a normal mood and affect.  Nursing note and vitals reviewed.  ED Course  Procedures  DIAGNOSTIC STUDIES: Oxygen Saturation is 98% on RA, normal by my interpretation.    COORDINATION OF CARE: 12:52 PM Pt presents today due to swelling and pain to his nose and upper lip. Discussed treatment plan with pt at bedside including 1x toradol injection and 3x 800 mg ibuprofen. Return precautions noted. Pt agreed to plan.  Labs Review Labs Reviewed - No data to display  Imaging Review No results found.   EKG Interpretation None      MDM   Final diagnoses:  Contusion, nose, initial encounter  Viral URI   Jesse Pugh presents with  contusion to the tip of the nose after his dog ran into him 3 days ago. Patient with mild swelling to the nose and surrounding tissue of the upper lip. No tenderness to palpation of the orbits or zygomatic arch.  No erythema or induration to suggest cellulitis or abscess. No lacerations or open wounds. No dental pain to suggest dental abscess. Patient also with URI symptoms. No lateralizing symptoms, fever or chills to suggest sinusitis. No tenderness to palpation of the sinuses.  Conservative therapies discussed including ice and ibuprofen.  BP 147/96 mmHg  Pulse 73  Temp(Src) 98.2 F (36.8 C) (Oral)  Resp 16  Ht 6\' 1"  (1.854 m)  Wt 261 lb 6.4 oz (118.57 kg)  BMI 34.49 kg/m2  SpO2 98%  I personally performed the services described in this documentation, which was scribed in my presence. The recorded information has been reviewed and is accurate.    Dahlia ClientHannah Danish Ruffins, PA-C 03/10/15 1321  Vanetta MuldersScott Zackowski, MD 03/11/15 [phone]

## 2015-03-10 NOTE — ED Notes (Signed)
Pt was 'head-butted" by his dog 2 days ago. C/o pain and swelling to nose. Some swelling noted. Denies LOC.

## 2015-05-21 ENCOUNTER — Encounter (HOSPITAL_COMMUNITY): Payer: Self-pay | Admitting: Emergency Medicine

## 2015-05-21 ENCOUNTER — Emergency Department (HOSPITAL_COMMUNITY)
Admission: EM | Admit: 2015-05-21 | Discharge: 2015-05-21 | Disposition: A | Discharge status: Home / Self Care | Payer: Self-pay | Attending: Emergency Medicine | Admitting: Emergency Medicine

## 2015-05-21 DIAGNOSIS — F17210 Nicotine dependence, cigarettes, uncomplicated: Secondary | ICD-10-CM | POA: Insufficient documentation

## 2015-05-21 DIAGNOSIS — J029 Acute pharyngitis, unspecified: Secondary | ICD-10-CM | POA: Insufficient documentation

## 2015-05-21 DIAGNOSIS — H9202 Otalgia, left ear: Secondary | ICD-10-CM | POA: Insufficient documentation

## 2015-05-21 LAB — RAPID STREP SCREEN (MED CTR MEBANE ONLY): STREPTOCOCCUS, GROUP A SCREEN (DIRECT): NEGATIVE

## 2015-05-21 MED ORDER — IBUPROFEN 100 MG/5ML PO SUSP: 600.0000 mg | Freq: Four times a day (QID) | ORAL | Status: DC

## 2015-05-21 NOTE — ED Provider Notes (Signed)
CSN: [id number]     Arrival date & time 05/21/15  1821 History  By signing my name below, I, Jesse Pugh, attest that this documentation has been prepared under the direction and in the presence of [employer] RubbermaidJeffrey Nakiyah Beverley, PA-C. Electronically Signed: Phillis HaggisGabriella Pugh, ED Scribe. 05/21/2015. 9:23 PM.  Chief Complaint  Patient presents with  . Otalgia  . Sore Throat   The history is provided by the patient. No language interpreter was used.  HPI Comments: Jesse Pugh is a 40 y.o. male who presents to the Emergency Department complaining of gradually worsening left otalgia and sore throat onset one day ago. Pt states that he is able to swallow secretions but has pain with swallowing liquids and food. He reports associated mild cough. He has not tried anything for his symptoms. He denies fever, chills, nausea, or vomiting. Pt is not a smoker.  History reviewed. No pertinent past medical history. History reviewed. No pertinent past surgical history. No family history on file. Social History  Substance Use Topics  . Smoking status: Heavy Tobacco Smoker -- 0.50 packs/day    Types: Cigarettes  . Smokeless tobacco: None  . Alcohol Use: Yes    Review of Systems  All other systems reviewed and are negative.  Allergies  Review of patient's allergies indicates no known allergies.  Home Medications   Prior to Admission medications   Medication Sig Start Date End Date Taking? Authorizing Provider  clindamycin (CLEOCIN) 150 MG capsule Take 1 capsule (150 mg total) by mouth every 6 (six) hours. 01/18/14   Fayrene HelperBowie Tran, PA-C  ibuprofen (CHILDRENS IBUPROFEN) 100 MG/5ML suspension Take 30 mLs (600 mg total) by mouth every 6 (six) hours. 05/21/15   Eyvonne MechanicJeffrey Micca Matura, PA-C  oxyCODONE-acetaminophen (PERCOCET/ROXICET) 5-325 MG per tablet Take 1 tablet by mouth every 6 (six) hours as needed for severe pain. 01/18/14   Fayrene HelperBowie Tran, PA-C   BP 139/90 mmHg  Pulse 91  Temp(Src) 99 F (37.2 C) (Oral)  SpO2 100%    Physical Exam  Constitutional: He is oriented to person, place, and time. He appears well-developed and well-nourished.  HENT:  Head: Normocephalic and atraumatic.  Right Ear: Tympanic membrane, external ear and ear canal normal.  Left Ear: Tympanic membrane, external ear and ear canal normal.  Mouth/Throat: Uvula is midline, oropharynx is clear and moist and mucous membranes are normal. No oropharyngeal exudate, posterior oropharyngeal edema, posterior oropharyngeal erythema or tonsillar abscesses.  Eyes: EOM are normal.  Neck: Normal range of motion. Neck supple.  Cardiovascular: Normal rate and regular rhythm.   Pulmonary/Chest: Effort normal and breath sounds normal.  Abdominal: Soft. Bowel sounds are normal. There is no tenderness.  Musculoskeletal: Normal range of motion.  Neurological: He is alert and oriented to person, place, and time.  Skin: Skin is warm and dry.  Psychiatric: He has a normal mood and affect. His behavior is normal.  Nursing note and vitals reviewed.   ED Course  Procedures (including critical care time) DIAGNOSTIC STUDIES: Oxygen Saturation is 100% on RA, normal by my interpretation.    COORDINATION OF CARE: 9:22 PM-Discussed treatment plan which includes strep screen with pt at bedside and pt agreed to plan.    Labs Review Labs Reviewed  RAPID STRE[facility] SCREEN ([facility])  CULTURE, GROUP A STREP [facility])    Imaging Review No results found. I have personally reviewed and evaluated these images and lab results as part of my medical decision-making.   EKG Interpretation None      MDM  Final diagnoses:  Sore throat   Labs: Rapid strep negative  Imaging:  Consults:  Therapeutics:  Discharge Meds: Children's ibuprofen  Assessment/Plan:   Pt presents with likely viral pharyngitis. No difficulty swallowing, drooling, dysphonia,muffled voice, stridor, swelling of the neck, trismus, mouth pain, swelling/ pain in submandibular area or  floor of mouth ,assymetry of tonsils, or ulcerations; unlikely epiglottitis, PTA, submandibular space infection, retropharyngeal space infection, or HIV. Pt treated here in the ED with therapeutics listed above, given strict return precautions, PCP follow-up for re-evaluation if symptoms persist beyond 5-7 days in duration, return to the ED if they worsen. Pt verbalized understanding and agreement to today's plan and had no further questions or concerns at the time of discharge.     I personally performed the services described in this documentation, which was scribed in my presence. The recorded information has been reviewed and is accurate.    Eyvonne Mechanic, PA-C 05/23/15 1610  Lavera Guise, MD 05/23/15 2020

## 2015-05-21 NOTE — ED Notes (Signed)
Patient was alert, oriented and stable upon discharge. RN went over AVS and patient had no further questions.  

## 2015-05-21 NOTE — ED Notes (Signed)
Pt reports left ear pain and sore throat.

## 2015-05-21 NOTE — Discharge Instructions (Signed)

## 2015-05-24 LAB — CULTURE, GROUP A STREP (THRC)

## 2015-08-20 ENCOUNTER — Emergency Department (HOSPITAL_COMMUNITY)
Admission: EM | Admit: 2015-08-20 | Discharge: 2015-08-22 | Disposition: A | Discharge status: Home / Self Care | Payer: Managed Care, Other (non HMO) | Attending: Emergency Medicine | Admitting: Emergency Medicine

## 2015-08-20 ENCOUNTER — Encounter (HOSPITAL_COMMUNITY): Payer: Self-pay

## 2015-08-20 DIAGNOSIS — F17210 Nicotine dependence, cigarettes, uncomplicated: Secondary | ICD-10-CM | POA: Insufficient documentation

## 2015-08-20 DIAGNOSIS — R109 Unspecified abdominal pain: Secondary | ICD-10-CM | POA: Diagnosis present

## 2015-08-20 LAB — URINALYSIS, ROUTINE W REFLEX MICROSCOPIC
Bilirubin Urine: NEGATIVE
Glucose, UA: NEGATIVE mg/dL
Ketones, ur: 15 mg/dL — AB
NITRITE: NEGATIVE
Protein, ur: 30 mg/dL — AB
SPECIFIC GRAVITY, URINE: 1.029 (ref 1.005–1.030)
pH: 6.5 (ref 5.0–8.0)

## 2015-08-20 LAB — CBC
HCT: 45 % (ref 39.0–52.0)
HEMOGLOBIN: 15.2 g/dL (ref 13.0–17.0)
MCH: 30 pg (ref 26.0–34.0)
MCHC: 33.8 g/dL (ref 30.0–36.0)
MCV: 88.8 fL (ref 78.0–100.0)
PLATELETS: 321 10*3/uL (ref 150–400)
RBC: 5.07 MIL/uL (ref 4.22–5.81)
RDW: 13.1 % (ref 11.5–15.5)
WBC: 7 10*3/uL (ref 4.0–10.5)

## 2015-08-20 LAB — COMPREHENSIVE METABOLIC PANEL
ALT: 17 U/L (ref 17–63)
ANION GAP: 10 (ref 5–15)
AST: 19 U/L (ref 15–41)
Albumin: 3.8 g/dL (ref 3.5–5.0)
Alkaline Phosphatase: 93 U/L (ref 38–126)
BUN: 11 mg/dL (ref 6–20)
CHLORIDE: 104 mmol/L (ref 101–111)
CO2: 26 mmol/L (ref 22–32)
Calcium: 9.2 mg/dL (ref 8.9–10.3)
Creatinine, Ser: 1 mg/dL (ref 0.61–1.24)
Glucose, Bld: 106 mg/dL — ABNORMAL HIGH (ref 65–99)
POTASSIUM: 3.6 mmol/L (ref 3.5–5.1)
SODIUM: 140 mmol/L (ref 135–145)
Total Bilirubin: 0.5 mg/dL (ref 0.3–1.2)
Total Protein: 8.1 g/dL (ref 6.5–8.1)

## 2015-08-20 LAB — URINE MICROSCOPIC-ADD ON

## 2015-08-20 LAB — LIPASE, BLOOD: Lipase: 19 U/L (ref 11–51)

## 2015-08-20 NOTE — ED Notes (Signed)
Pt complaining of L sided abdominal pain. Denies any N/V/D. Pain increases with sitting.

## 2015-08-23 ENCOUNTER — Emergency Department (HOSPITAL_COMMUNITY)
Admission: EM | Admit: 2015-08-23 | Discharge: 2015-08-24 | Disposition: A | Discharge status: Home / Self Care | Payer: Managed Care, Other (non HMO) | Attending: Emergency Medicine | Admitting: Emergency Medicine

## 2015-08-23 ENCOUNTER — Encounter (HOSPITAL_COMMUNITY): Payer: Self-pay | Admitting: Emergency Medicine

## 2015-08-23 ENCOUNTER — Emergency Department (HOSPITAL_COMMUNITY): Payer: Managed Care, Other (non HMO)

## 2015-08-23 DIAGNOSIS — R109 Unspecified abdominal pain: Secondary | ICD-10-CM | POA: Diagnosis present

## 2015-08-23 DIAGNOSIS — F17210 Nicotine dependence, cigarettes, uncomplicated: Secondary | ICD-10-CM | POA: Insufficient documentation

## 2015-08-23 DIAGNOSIS — Y658 Other specified misadventures during surgical and medical care: Secondary | ICD-10-CM | POA: Insufficient documentation

## 2015-08-23 DIAGNOSIS — T83122A Displacement of urinary stent, initial encounter: Secondary | ICD-10-CM | POA: Insufficient documentation

## 2015-08-23 LAB — BASIC METABOLIC PANEL
Anion gap: 10 (ref 5–15)
BUN: 13 mg/dL (ref 6–20)
CO2: 24 mmol/L (ref 22–32)
Calcium: 9 mg/dL (ref 8.9–10.3)
Chloride: 106 mmol/L (ref 101–111)
Creatinine, Ser: 0.9 mg/dL (ref 0.61–1.24)
GFR calc Af Amer: >60 mL/min (ref >60)
GFR calc non Af Amer: >60 mL/min (ref >60)
Glucose, Bld: 96 mg/dL (ref 65–99)
Potassium: 3.8 mmol/L (ref 3.5–5.1)
Sodium: 140 mmol/L (ref 135–145)

## 2015-08-23 LAB — CBC WITH DIFFERENTIAL/PLATELET
Basophils Absolute: 0 10*3/uL (ref 0.0–0.1)
Basophils Relative: 0 %
Eosinophils Absolute: 0 10*3/uL (ref 0.0–0.7)
Eosinophils Relative: 0 %
HCT: 42.1 % (ref 39.0–52.0)
Hemoglobin: 14.7 g/dL (ref 13.0–17.0)
Lymphocytes Relative: 31 %
Lymphs Abs: 2.2 10*3/uL (ref 0.7–4.0)
MCH: 30.1 pg (ref 26.0–34.0)
MCHC: 34.9 g/dL (ref 30.0–36.0)
MCV: 86.3 fL (ref 78.0–100.0)
Monocytes Absolute: 0.9 10*3/uL (ref 0.1–1.0)
Monocytes Relative: 13 %
Neutro Abs: 4 10*3/uL (ref 1.7–7.7)
Neutrophils Relative %: 56 %
Platelets: 307 10*3/uL (ref 150–400)
RBC: 4.88 MIL/uL (ref 4.22–5.81)
RDW: 12.8 % (ref 11.5–15.5)
WBC: 7.1 10*3/uL (ref 4.0–10.5)

## 2015-08-23 LAB — URINE MICROSCOPIC-ADD ON

## 2015-08-23 LAB — URINALYSIS, ROUTINE W REFLEX MICROSCOPIC
Bilirubin Urine: NEGATIVE
Glucose, UA: NEGATIVE mg/dL
Ketones, ur: NEGATIVE mg/dL
Nitrite: NEGATIVE
Protein, ur: NEGATIVE mg/dL
Specific Gravity, Urine: 1.022 (ref 1.005–1.030)
pH: 5.5 (ref 5.0–8.0)

## 2015-08-23 MED ORDER — HYDROMORPHONE HCL 1 MG/ML IJ SOLN
1.0000 mg | Freq: Once | INTRAMUSCULAR | Status: AC
  Administered 2015-08-23: 1 mg via INTRAVENOUS
  Filled 2015-08-23: qty 1

## 2015-08-23 NOTE — Progress Notes (Signed)
Patient listed as having [employer] [place] without a pcp.  [facility] who accept [employer] insurance within a 15 mile radius of patient's zip code [phone].  Patient thankful for services.  No further EDCm needs at this time.

## 2015-08-23 NOTE — ED Notes (Signed)
Pt states he has left flank pain that started on Tuesday  Denies N/V/D

## 2015-08-24 MED ORDER — SODIUM CHLORIDE 0.9 % IV BOLUS (SEPSIS)
1000.0000 mL | Freq: Once | INTRAVENOUS | Status: AC
  Administered 2015-08-24: 1000 mL via INTRAVENOUS

## 2015-08-24 MED ORDER — KETOROLAC TROMETHAMINE 30 MG/ML IJ SOLN
30.0000 mg | Freq: Once | INTRAMUSCULAR | Status: AC
  Administered 2015-08-24: 30 mg via INTRAVENOUS
  Filled 2015-08-24: qty 1

## 2015-08-24 MED ORDER — OXYCODONE-ACETAMINOPHEN 5-325 MG PO TABS: 1.0000 | ORAL_TABLET | Freq: Four times a day (QID) | ORAL | Status: DC | PRN

## 2015-08-24 MED ORDER — TAMSULOSIN HCL 0.4 MG PO CAPS: 0.4000 mg | ORAL_CAPSULE | Freq: Every day | ORAL | Status: DC

## 2015-08-24 NOTE — ED Provider Notes (Signed)
CSN: [id number]     Arrival date & time 08/23/15  2031 History   First MD Initiated Contact with Patient 08/23/15 2218     Chief Complaint  Patient presents with  . Flank Pain     (Consider location/radiation/quality/duration/timing/severity/associated sxs/prior Treatment) HPI Patient presents to the emergency department with left flank pain that started 2 days ago.  The patient states that he started with left flank pain that seems to radiate to his lower abdomen.  Patient states he was given ibuprofen by his wife, which reduced the symptoms.  He states that the pain got worse today.  The patient denies chest pain, shortness of breath, headache,blurred vision, neck pain, fever, cough, weakness, numbness, dizziness, anorexia, edema, nausea, vomiting, diarrhea, rash, back pain, dysuria, hematemesis, bloody stool, near syncope, or syncope.  The patient states nothing seems make the condition worse History reviewed. No pertinent past medical history. History reviewed. No pertinent past surgical history. History reviewed. No pertinent family history. Social History  Substance Use Topics  . Smoking status: Current Every Day Smoker -- 0.50 packs/day    Types: Cigarettes  . Smokeless tobacco: None  . Alcohol Use: Yes     Comment: occ    Review of Systems   All other systems negative except as documented in the HPI. All pertinent positives and negatives as reviewed in the HPI. Allergies  Review of patient's allergies indicates no known allergies.  Home Medications   Prior to Admission medications   Medication Sig Start Date End Date Taking? Authorizing Provider  ibuprofen (ADVIL,MOTRIN) 200 MG tablet Take 400 mg by mouth every 6 (six) hours as needed for moderate pain.   Yes Historical Provider, MD  clindamycin (CLEOCIN) 150 MG capsule Take 1 capsule (150 mg total) by mouth every 6 (six) hours. Patient not taking: Reported on 08/23/2015 01/18/14   Fayrene HelperBowie Tran, PA-C  ibuprofen (CHILDRENS  IBUPROFEN) 100 MG/5ML suspension Take 30 mLs (600 mg total) by mouth every 6 (six) hours. Patient not taking: Reported on 08/23/2015 05/21/15   Eyvonne MechanicJeffrey Hedges, PA-C  oxyCODONE-acetaminophen (PERCOCET/ROXICET) 5-325 MG per tablet Take 1 tablet by mouth every 6 (six) hours as needed for severe pain. Patient not taking: Reported on 08/23/2015 01/18/14   Fayrene HelperBowie Tran, PA-C   BP 106/65 mmHg  Pulse 83  Temp(Src) 98.4 F (36.9 C) (Oral)  Resp 18  Ht 6\' 2"  (1.88 m)  Wt 120.203 kg  BMI 34.01 kg/m2  SpO2 95% Physical Exam  Constitutional: He is oriented to person, place, and time. He appears well-developed and well-nourished. No distress.  HENT:  Head: Normocephalic and atraumatic.  Mouth/Throat: Oropharynx is clear and moist.  Eyes: Pupils are equal, round, and reactive to light.  Neck: Normal range of motion. Neck supple.  Cardiovascular: Normal rate, regular rhythm and normal heart sounds.  Exam reveals no gallop and no friction rub.   No murmur heard. Pulmonary/Chest: Effort normal and breath sounds normal. No respiratory distress. He has no wheezes.  Abdominal: Soft. Normal appearance and bowel sounds are normal. He exhibits no distension. There is tenderness.    Neurological: He is alert and oriented to person, place, and time. He exhibits normal muscle tone. Coordination normal.  Skin: Skin is warm and dry. No rash noted. No erythema.  Psychiatric: He has a normal mood and affect. His behavior is normal.  Nursing note and vitals reviewed.   ED Course  Procedures (including critical care time) Labs Review Labs Reviewed  URINALYSIS, ROUTINE W REFLEX MICROSCOPIC (NOT AT  [facility]) - Abnormal; Notable for the following:    Color, Urine AMBER (*)    APPearance CLOUDY (*)    Hgb urine dipstick LARGE (*)    Leukocytes, UA MODERATE (*)    All other components within normal limits  URINE MICROSCOPIC-ADD ON - Abnormal; Notable for the following:    Squamous Epithelial / LPF 0-5 (*)    Bacteria,  UA FEW (*)    All other components within normal limits  URINE CULTURE  BASIC METABOLIC PANEL  CBC WITH DIFFERENTIAL/PLATELET    Imaging Review Ct Renal Stone Study  08/24/2015  CLINICAL DATA:  Left flank pain starting Tuesday.  Blood in urine. EXAM: CT ABDOMEN AND PELVIS WITHOUT CONTRAST TECHNIQUE: Multidetector CT imaging of the abdomen and pelvis was performed following the standard protocol without IV contrast. COMPARISON:  None. FINDINGS: Mild dependent atelectasis in the lung bases. The kidneys are symmetrical in size and shape. No renal stones demonstrated. No hydronephrosis or hydroureter. There is a 4 mm stone in the left posterior bladder which may be in the left ureterovesical junction or may have been recently passed into the bladder. Mild stranding around the left ureter. Mild bladder wall thickening could be due to underdistention or may indicate cystitis. The unenhanced appearance of the liver, spleen, gallbladder, pancreas, adrenal glands, abdominal aorta, inferior vena cava, and retroperitoneal lymph nodes is unremarkable. Stomach, small bowel, and colon are not abnormally distended. No free air or free fluid in the abdomen. Pelvis: The appendix is normal. No free or loculated pelvic fluid collections. No pelvic mass or lymphadenopathy. No destructive bone lesions. IMPRESSION: 4 mm stone in the left posterior bladder likely represents or recently passed stone but could still be at the ureterovesical junction. No proximal hydronephrosis or hydroureter. Electronically Signed   By: Burman Nieves M.D.   On: 08/24/2015 00:23   I have personally reviewed and evaluated these images and lab results as part of my medical decision-making.   EKG Interpretation None      MDM   Final diagnoses:  Flank pain    Patient retreated for ureteral stone and referred to urology.  The patient is advised of the results and all questions were answered.  Patient is feeling at this time following IV  pain medications and fluids    Charlestine Night, PA-C 08/24/15 0032  Lyndal Pulley, MD 08/24/15 [phone]

## 2015-08-24 NOTE — Discharge Instructions (Signed)
Return here as needed.  Increase her fluid intake.  Follow-up with the urologist provided

## 2015-08-25 LAB — URINE CULTURE

## 2015-10-21 ENCOUNTER — Emergency Department (HOSPITAL_COMMUNITY)
Admission: EM | Admit: 2015-10-21 | Discharge: 2015-10-21 | Disposition: A | Discharge status: Home / Self Care | Payer: Managed Care, Other (non HMO) | Attending: Emergency Medicine | Admitting: Emergency Medicine

## 2015-10-21 ENCOUNTER — Encounter (HOSPITAL_COMMUNITY): Payer: Self-pay | Admitting: *Deleted

## 2015-10-21 DIAGNOSIS — F17210 Nicotine dependence, cigarettes, uncomplicated: Secondary | ICD-10-CM | POA: Diagnosis not present

## 2015-10-21 DIAGNOSIS — K0889 Other specified disorders of teeth and supporting structures: Secondary | ICD-10-CM

## 2015-10-21 MED ORDER — TRAMADOL HCL 50 MG PO TABS
50.0000 mg | ORAL_TABLET | Freq: Once | ORAL | Status: AC
  Administered 2015-10-21: 50 mg via ORAL
  Filled 2015-10-21: qty 1

## 2015-10-21 MED ORDER — TRAMADOL HCL 50 MG PO TABS: 50.0000 mg | ORAL_TABLET | Freq: Four times a day (QID) | ORAL | Status: DC | PRN

## 2015-10-21 NOTE — Discharge Instructions (Signed)
Community Resource Guide Dental °The [employer]’s “211” is a great source of information about community services available.  Access by dialing 2-1-1 from anywhere in [place], or by website -  www.nc211.org.  ° °Other Local Resources (Updated 05/2015) ° °Dental  Care °  °Services ° °  °Phone Number and Address  °Cost  °[facility] For children 0 - 39 years of age:  °• Cleaning °• Tooth brushing/flossing instruction °• Sealants, fillings, crowns °• Extractions °• Emergency treatment  [phone] °[address] °[place], [place] [address] Charges based on family income.  Medicaid and some insurance plans accepted.   °  °[facility] [place] • Cleaning °• Sealants, fillings, crowns °• Extractions °• Emergency treatment [phone] °[address] °[place], [place] ° Pregnant women 18 years of age or older with a Medicaid card  °[facility] [place] • Cleaning °• Sealants, fillings, crowns °• Extractions °• Emergency treatment [phone] °[address] °[place], [place] Pregnant women 18 years of age or older with a Medicaid card  °[facility] For children 0 - 39 years of age:  °• Cleaning °• Tooth brushing/flossing instruction °• Sealants, fillings, crowns °• Extractions °• Emergency treatment °Limited orthodontic services for patients with Medicaid [phone] °[address] °[place], [place] [address] Medicaid and [place] Health Choice cover for children up to age 39 and pregnant women.  Parents of children up to age 39 without Medicaid pay a reduced fee at time of service.  °[facility] [place] For children 0 - 39 years of age:  °• Cleaning °• Tooth brushing/flossing instruction °• Sealants, fillings, crowns °• Extractions °• Emergency treatment °Limited orthodontic services for patients with Medicaid  [phone] °[address] °[place], [place].  Medicaid and [place] Health Choice cover for children up to age 39 and pregnant women.  Parents of children up to age 39 without Medicaid pay a reduced fee.  °[facility] • Cleaning °• Sealants, fillings, crowns °• Extractions ° °Hours: Tuesdays and Thursdays, 4:15 - 8 pm [phone] °[address] °[place], [place] [address] Services free of charge to [place] residents ages 18-64 who do not have health insurance, Medicare, Medicaid, or VA benefits and fall within federal poverty guidelines  °[facility] ° ° ° Provides dental care in addition to primary medical care, nutritional counseling, and pharmacy: °• Cleaning °• Sealants, fillings, crowns °• Extractions ° ° ° ° ° ° ° ° ° ° ° ° ° ° ° ° ° [phone] °[facility], [address] °[place], [place] ° °[phone] °[facility], [address] [place], [place] ° °[phone] °[facility] °[place], [place] ° °[phone] °[facility], [address] °[place], [place] ° °[phone] °[facility] °[address] °[place], [place] Accepts Medicaid, Medicare, most insurance.  Also provides services available to all with fees adjusted based on ability to pay.    °[facility] • Cleaning °• Tooth brushing/flossing instruction °• Sealants, fillings, crowns °• Extractions °• Emergency treatment °Hours: Tuesdays, Thursdays, and Fridays from 8 am to 5 pm by appointment only. [phone] °[address] °[place], [place] [address] [place] residents with Medicaid (depending on eligibility) and children with  Health Choice - call for more information.  °  [facility] • Extractions only ° °Hours: 2nd and 4th Thursday of each month from 6:30 am - 9 am.   [phone]. [phone] °[address] °[place], [place] [address] Ages 18 and older only.  Patients are seen on a first come, first served basis.  °[facility] • Cleanings °• Fillings °• Extractions °• Orthodontics °• Endodontics °• Implants/Crowns/Bridges °• Complete and partial dentures [phone] °[place],  Patients must complete an application for services.  There is often a waiting list.   ° °

## 2015-10-21 NOTE — ED Provider Notes (Signed)
CSN: [id number]     Arrival date & time 10/21/15  1339 History  By signing my name below, I, Tanda Rockers, attest that this documentation has been prepared under the direction and in the presence of [employer], PA-C. Electronically Signed: Tanda Rockers, ED Scribe. 10/21/2015. 2:17 PM.   Chief Complaint  Patient presents with  . Dental Pain   The history is provided by the patient. No language interpreter was used.    HPI Comments: Jesse Pugh is a 40 y.o. male who presents to the Emergency Department complaining of gradual onset, constant, 10/10, left lower dental pain x 2-3 days. Pt has never had similar pain. Pt does not currently have a dentist. He has been taking Tylenol and Goodies without relief. Denies fever, facial swelling, or any other associated symptoms.   History reviewed. No pertinent past medical history. History reviewed. No pertinent past surgical history. History reviewed. No pertinent family history. Social History  Substance Use Topics  . Smoking status: Current Every Day Smoker -- 0.50 packs/day    Types: Cigarettes  . Smokeless tobacco: None  . Alcohol Use: Yes     Comment: occ    Review of Systems  All other systems reviewed and are negative.  Allergies  Review of patient's allergies indicates no known allergies.  Home Medications   Prior to Admission medications   Medication Sig Start Date End Date Taking? Authorizing Provider  clindamycin (CLEOCIN) 150 MG capsule Take 1 capsule (150 mg total) by mouth every 6 (six) hours. Patient not taking: Reported on 08/23/2015 01/18/14   Fayrene Helper, PA-C  ibuprofen (ADVIL,MOTRIN) 200 MG tablet Take 400 mg by mouth every 6 (six) hours as needed for moderate pain.    Historical Provider, MD  ibuprofen (CHILDRENS IBUPROFEN) 100 MG/5ML suspension Take 30 mLs (600 mg total) by mouth every 6 (six) hours. Patient not taking: Reported on 08/23/2015 05/21/15   Eyvonne Mechanic, PA-C  oxyCODONE-acetaminophen  (PERCOCET/ROXICET) 5-325 MG tablet Take 1 tablet by mouth every 6 (six) hours as needed for severe pain. 08/24/15   Charlestine Night, PA-C  tamsulosin (FLOMAX) 0.4 MG CAPS capsule Take 1 capsule (0.4 mg total) by mouth daily. 08/24/15   Charlestine Night, PA-C  traMADol (ULTRAM) 50 MG tablet Take 1 tablet (50 mg total) by mouth every 6 (six) hours as needed. 10/21/15   Kristee Angus, PA-C   BP 151/101 mmHg  Pulse 80  Temp(Src) 98.5 F (36.9 C) (Oral)  Resp 18  SpO2 100%   Physical Exam  Constitutional: He is oriented to person, place, and time. He appears well-developed and well-nourished. No distress.  HENT:  Head: Normocephalic.  Mouth/Throat: Uvula is midline, oropharynx is clear and moist and mucous membranes are normal. No oropharyngeal exudate, posterior oropharyngeal edema, posterior oropharyngeal erythema or tonsillar abscesses.  External exam shows no asymmetry of the jaw line or face, no signs of obvious swelling, edema, infection. Full active range of motion of the jaw. Neck is supple with full active range of motion, no tenderness to palpation of the soft tissues  Gumline palpated no obvious signs of infection including warmth, redness, abscess, tenderness. Posterior oropharynx clear with no signs of infection, uvula is midline and rises with phonation, tonsils present and normal in size, symmetrical bilateral, tongue is normal soft touch with full active range of motion, floor mouth is soft nontender.  Numerous decaying teeth throughout.   Eyes: Conjunctivae are normal. Pupils are equal, round, and reactive to light. Right eye exhibits no discharge. Left  eye exhibits no discharge.  Neck: Normal range of motion. Neck supple. No JVD present. No tracheal deviation present. No thyromegaly present.  Pulmonary/Chest: No stridor.  Lymphadenopathy:    He has no cervical adenopathy.  Neurological: He is alert and oriented to person, place, and time.  Skin: Skin is warm and dry. No  rash noted. He is not diaphoretic. No erythema. No pallor.  Psychiatric: He has a normal mood and affect. His behavior is normal. Judgment and thought content normal.  Nursing note and vitals reviewed.   ED Course  Procedures (including critical care time)  DIAGNOSTIC STUDIES: Oxygen Saturation is 100% on RA, normal by my interpretation.    COORDINATION OF CARE: 2:13 PM-Discussed treatment plan which includes ultram tablet with pt at bedside and pt agreed to plan.    MDM   Final diagnoses:  Pain, dental   Labs: None  Imaging: None  Consults: None  Therapeutics: Ultram  Discharge Meds: ultram  Assessment/Plan:Patient here with uncomplicated dental pain, treated with both medications, discharged home with symptomatic care instructions and follow-up    I personally performed the services described in this documentation, which was scribed in my presence. The recorded information has been reviewed and is accurate.      Eyvonne MechanicJeffrey Javohn Basey, PA-C 10/21/15 1428  Gerhard Munchobert Lockwood, MD 10/22/15 [phone]

## 2015-10-21 NOTE — ED Notes (Signed)
Declined W/C at D/C and was escorted to lobby by RN. 

## 2015-10-21 NOTE — ED Notes (Signed)
PT reports pain to upper Lt tooth for 2days.

## 2016-02-28 ENCOUNTER — Encounter (HOSPITAL_COMMUNITY): Payer: Self-pay | Admitting: Emergency Medicine

## 2016-02-28 ENCOUNTER — Emergency Department (HOSPITAL_COMMUNITY)
Admission: EM | Admit: 2016-02-28 | Discharge: 2016-02-28 | Disposition: A | Discharge status: Home / Self Care | Payer: Managed Care, Other (non HMO) | Attending: Emergency Medicine | Admitting: Emergency Medicine

## 2016-02-28 DIAGNOSIS — M545 Low back pain, unspecified: Secondary | ICD-10-CM

## 2016-02-28 DIAGNOSIS — F17210 Nicotine dependence, cigarettes, uncomplicated: Secondary | ICD-10-CM | POA: Diagnosis not present

## 2016-02-28 DIAGNOSIS — Z7982 Long term (current) use of aspirin: Secondary | ICD-10-CM | POA: Diagnosis not present

## 2016-02-28 LAB — CBC WITH DIFFERENTIAL/PLATELET
Basophils Absolute: 0 10*3/uL (ref 0.0–0.1)
Basophils Relative: 0 %
Eosinophils Absolute: 0 10*3/uL (ref 0.0–0.7)
Eosinophils Relative: 0 %
HEMATOCRIT: 45.5 % (ref 39.0–52.0)
HEMOGLOBIN: 15.9 g/dL (ref 13.0–17.0)
LYMPHS ABS: 2.8 10*3/uL (ref 0.7–4.0)
LYMPHS PCT: 45 %
MCH: 31.1 pg (ref 26.0–34.0)
MCHC: 34.9 g/dL (ref 30.0–36.0)
MCV: 88.9 fL (ref 78.0–100.0)
MONO ABS: 0.5 10*3/uL (ref 0.1–1.0)
MONOS PCT: 9 %
NEUTROS ABS: 2.8 10*3/uL (ref 1.7–7.7)
NEUTROS PCT: 46 %
Platelets: 325 10*3/uL (ref 150–400)
RBC: 5.12 MIL/uL (ref 4.22–5.81)
RDW: 13.2 % (ref 11.5–15.5)
WBC: 6.1 10*3/uL (ref 4.0–10.5)

## 2016-02-28 LAB — COMPREHENSIVE METABOLIC PANEL
ALBUMIN: 3.6 g/dL (ref 3.5–5.0)
ALK PHOS: 85 U/L (ref 38–126)
ALT: 19 U/L (ref 17–63)
ANION GAP: 9 (ref 5–15)
AST: 20 U/L (ref 15–41)
BILIRUBIN TOTAL: 0.2 mg/dL — AB (ref 0.3–1.2)
BUN: 10 mg/dL (ref 6–20)
CALCIUM: 9 mg/dL (ref 8.9–10.3)
CO2: 25 mmol/L (ref 22–32)
Chloride: 104 mmol/L (ref 101–111)
Creatinine, Ser: 0.81 mg/dL (ref 0.61–1.24)
GFR calc Af Amer: >60 mL/min (ref >60)
GLUCOSE: 104 mg/dL — AB (ref 65–99)
Potassium: 3.6 mmol/L (ref 3.5–5.1)
Sodium: 138 mmol/L (ref 135–145)
TOTAL PROTEIN: 7.3 g/dL (ref 6.5–8.1)

## 2016-02-28 LAB — URINALYSIS, ROUTINE W REFLEX MICROSCOPIC
Bilirubin Urine: NEGATIVE
GLUCOSE, UA: NEGATIVE mg/dL
Ketones, ur: NEGATIVE mg/dL
NITRITE: NEGATIVE
PH: 6.5 (ref 5.0–8.0)
Protein, ur: NEGATIVE mg/dL
SPECIFIC GRAVITY, URINE: 1.022 (ref 1.005–1.030)

## 2016-02-28 LAB — URINE MICROSCOPIC-ADD ON

## 2016-02-28 MED ORDER — HYDROCODONE-ACETAMINOPHEN 5-325 MG PO TABS: 1.0000 | ORAL_TABLET | Freq: Four times a day (QID) | ORAL | 0 refills | Status: DC | PRN

## 2016-02-28 MED ORDER — IBUPROFEN 600 MG PO TABS: 600.0000 mg | ORAL_TABLET | Freq: Three times a day (TID) | ORAL | 0 refills | Status: DC | PRN

## 2016-02-28 MED ORDER — OXYCODONE-ACETAMINOPHEN 5-325 MG PO TABS
1.0000 | ORAL_TABLET | Freq: Once | ORAL | Status: AC
  Administered 2016-02-28: 1 via ORAL

## 2016-02-28 MED ORDER — OXYCODONE-ACETAMINOPHEN 5-325 MG PO TABS
ORAL_TABLET | ORAL | Status: AC
  Filled 2016-02-28: qty 1

## 2016-02-28 NOTE — ED Provider Notes (Signed)
[facility] DEPT Provider Note   CSN: [id number] Arrival date & time: 02/28/16  0037     History   Chief Complaint Chief Complaint  Patient presents with  . Back Pain    HPI Jesse Pugh is a 40 y.o. male.  The history is provided by the patient.  Back Pain   This is a new problem. The current episode started 6 to 12 hours ago. The problem occurs constantly. The problem has not changed since onset.The pain is associated with no known injury. The pain is present in the lumbar spine. The pain does not radiate. The pain is moderate. Pertinent negatives include no fever, no abdominal pain and no weakness. Treatments tried: ASA. The treatment provided no relief.   Patient reports onset of low back pain earlier in the night just prior to going to work No trauma No leg weakness He reports this feels similar to prior kidney stones No other acute complaints  PMH - none Home Medications    Prior to Admission medications   Medication Sig Start Date End Date Taking? Authorizing Provider  aspirin 325 MG tablet Take 650 mg by mouth every 6 (six) hours as needed for mild pain.   Yes Historical Provider, MD  HYDROcodone-acetaminophen (NORCO/VICODIN) 5-325 MG tablet Take 1 tablet by mouth every 6 (six) hours as needed. 02/28/16   Zadie Rhineonald Tomeshia Pizzi, MD  ibuprofen (ADVIL,MOTRIN) 600 MG tablet Take 1 tablet (600 mg total) by mouth every 8 (eight) hours as needed. 02/28/16   Zadie Rhineonald Ezri Landers, MD    Family History No family history on file.  Social History Social History  Substance Use Topics  . Smoking status: Current Every Day Smoker    Packs/day: 0.50    Types: Cigarettes  . Smokeless tobacco: Never Used  . Alcohol use Yes     Comment: occ     Allergies   Review of patient's allergies indicates no known allergies.   Review of Systems Review of Systems  Constitutional: Negative for fever.  Gastrointestinal: Negative for abdominal pain.  Genitourinary: Negative for flank  pain.  Musculoskeletal: Positive for back pain.  Neurological: Negative for weakness.  All other systems reviewed and are negative.    Physical Exam Updated Vital Signs BP 131/91   Pulse 66   Temp 97.4 F (36.3 C) (Oral)   Resp 20   Ht 6\' 1"  (1.854 m)   Wt 111.1 kg   SpO2 97%   BMI 32.32 kg/m   Physical Exam CONSTITUTIONAL: Well developed/well nourished, resting comfortably HEAD: Normocephalic/atraumatic EYES: EOMI/PERRL ENMT: Mucous membranes moist NECK: supple no meningeal signs SPINE/BACK:mild tenderness to lumbar spine, No bruising/crepitance/stepoffs noted to spine CV: S1/S2 noted, no murmurs/rubs/gallops noted LUNGS: Lungs are clear to auscultation bilaterally, no apparent distress ABDOMEN: soft, nontender GU:no cva tenderness NEURO: Pt is awake/alert/appropriate, moves all extremitiesx4.  No facial droop.  He is ambulatory without difficulty.  No focal weakness in his legs is noted.  He has equal power in both LE without difficulty EXTREMITIES: pulses normal/equal, full ROM SKIN: warm, color normal PSYCH: no abnormalities of mood noted, alert and oriented to situation   ED Treatments / Results  Labs (all labs ordered are listed, but only abnormal results are displayed) Labs Reviewed  URINALYSIS, ROUTINE W REFLEX MICROSCOPIC (NOT [facility]) - Abnormal; Notable for the following:       Result Value   Hgb urine dipstick SMALL (*)    Leukocytes, UA SMALL (*)    All other components within normal limits  COMPREHENSIVE METABOLIC PANEL - Abnormal; Notable for the following:    Glucose, Bld 104 (*)    Total Bilirubin 0.2 (*)    All other components within normal limits  URINE MICROSCOPIC-ADD ON - Abnormal; Notable for the following:    Squamous Epithelial / LPF 0-5 (*)    Bacteria, UA FEW (*)    All other components within normal limits  CBC WITH DIFFERENTIAL/PLATELET    EKG  EKG Interpretation None       Radiology No results  found.  Procedures Procedures (including critical care time)  Medications Ordered in ED Medications  oxyCODONE-acetaminophen (PERCOCET/ROXICET) 5-325 MG per tablet 1 tablet (1 tablet Oral Given 02/28/16 0051)     Initial Impression / Assessment and Plan / ED Course  I have reviewed the triage vital signs and the nursing notes.  Pertinent labs  results that were available during my care of the patient were reviewed by me and considered in my medical decision making (see chart for details).  Clinical Course    Pt well appearing He is ambulatory He reports this feels like prior kidney stones, though his pain is actually more c/w MSK pain Will d/c home with short course of NSAIDs/vicodin We discussed strict ER return precautions   Final Clinical Impressions(s) / ED Diagnoses   Final diagnoses:  Acute midline low back pain without sciatica    New Prescriptions Discharge Medication List as of 02/28/2016  5:47 AM    START taking these medications   Details  HYDROcodone-acetaminophen (NORCO/VICODIN) 5-325 MG tablet Take 1 tablet by mouth every 6 (six) hours as needed., Starting Thu 02/28/2016, Print         Zadie Rhine, MD 02/28/16 [phone]

## 2016-02-28 NOTE — ED Triage Notes (Signed)
Pt. reports low back pain onset this evening , denies injury , no hematuria or fever .

## 2016-02-28 NOTE — Discharge Instructions (Signed)

## 2016-06-08 ENCOUNTER — Encounter (HOSPITAL_COMMUNITY): Payer: Self-pay

## 2016-06-08 ENCOUNTER — Emergency Department (HOSPITAL_COMMUNITY)
Admission: EM | Admit: 2016-06-08 | Discharge: 2016-06-09 | Disposition: A | Discharge status: Home / Self Care | Payer: Managed Care, Other (non HMO) | Attending: Emergency Medicine | Admitting: Emergency Medicine

## 2016-06-08 DIAGNOSIS — Z7982 Long term (current) use of aspirin: Secondary | ICD-10-CM | POA: Diagnosis not present

## 2016-06-08 DIAGNOSIS — R030 Elevated blood-pressure reading, without diagnosis of hypertension: Secondary | ICD-10-CM | POA: Diagnosis not present

## 2016-06-08 DIAGNOSIS — F17210 Nicotine dependence, cigarettes, uncomplicated: Secondary | ICD-10-CM | POA: Diagnosis not present

## 2016-06-08 DIAGNOSIS — R5381 Other malaise: Secondary | ICD-10-CM | POA: Diagnosis not present

## 2016-06-08 DIAGNOSIS — R6883 Chills (without fever): Secondary | ICD-10-CM | POA: Diagnosis present

## 2016-06-08 NOTE — ED Triage Notes (Signed)
Pt states he was at a super bowl party and then was on his way to work when he started to "feel funny" states he felt hot. Pt states his job told him to come get his BP checked.

## 2016-06-08 NOTE — ED Provider Notes (Signed)
[facility] Provider Note   CSN: [id number] Arrival date & time: 06/08/16  2345 By signing my name below, I, Levon HedgerElizabeth Hall, attest that this documentation has been prepared under the direction and in the presence of Dione Boozeavid Joaquim Tolen, MD . Electronically Signed: Levon HedgerElizabeth Hall, Scribe. 06/09/2016. 12:07 AM  History   Chief Complaint Chief Complaint  Patient presents with  . feeling funny    HPI Jesse Pugh is a 41 y.o. male with no significant PMHx who presents to the Emergency Department complaining of sudden onset, gradually improving chills which began a few hour PTA. Pt states he went to a [employer] party last night and states he felt "hot" before he left. When he arrived to work, pt states he was still having chills and was told to come to the ED to get his blood pressure checked. No treatments tried PTA. No alleviating or modifying factors noted. He denies cough, nausea, vomiting, diarrhea, sore throat, or rhinorrhea. Pt has no other complaints or symptoms at this time.   The history is provided by the patient. No language interpreter was used.   History reviewed. No pertinent past medical history.  There are no active problems to display for this patient.  History reviewed. No pertinent surgical history.   Home Medications    Prior to Admission medications   Medication Sig Start Date End Date Taking? Authorizing Provider  aspirin 325 MG tablet Take 650 mg by mouth every 6 (six) hours as needed for mild pain.    Historical Provider, MD  HYDROcodone-acetaminophen (NORCO/VICODIN) 5-325 MG tablet Take 1 tablet by mouth every 6 (six) hours as needed. 02/28/16   Zadie Rhineonald Wickline, MD  ibuprofen (ADVIL,MOTRIN) 600 MG tablet Take 1 tablet (600 mg total) by mouth every 8 (eight) hours as needed. 02/28/16   Zadie Rhineonald Wickline, MD    Family History No family history on file.  Social History Social History  Substance Use Topics  . Smoking status: Current Every Day Smoker   Packs/day: 0.50    Types: Cigarettes  . Smokeless tobacco: Never Used  . Alcohol use Yes     Comment: occ     Allergies   Patient has no known allergies.   Review of Systems Review of Systems  Constitutional: Positive for chills.  HENT: Negative for rhinorrhea and sore throat.   Respiratory: Negative for cough.   Gastrointestinal: Negative for diarrhea, nausea and vomiting.  All other systems reviewed and are negative.  Physical Exam Updated Vital Signs BP (!) 143/118   Pulse 96   Temp 98.6 F (37 C)   Resp 20   Ht 6\' 1"  (1.854 m)   Wt 220 lb (99.8 kg)   SpO2 98%   BMI 29.03 kg/m   Physical Exam  Constitutional: He is oriented to person, place, and time. He appears well-developed and well-nourished.  HENT:  Head: Normocephalic and atraumatic.  Eyes: EOM are normal. Pupils are equal, round, and reactive to light.  Neck: Normal range of motion. Neck supple. No JVD present.  Cardiovascular: Normal rate, regular rhythm and normal heart sounds.   No murmur heard. Pulmonary/Chest: Effort normal and breath sounds normal. He has no wheezes. He has no rales. He exhibits no tenderness.  Abdominal: Soft. Bowel sounds are normal. He exhibits no distension and no mass. There is no tenderness.  Musculoskeletal: Normal range of motion. He exhibits no edema.  Lymphadenopathy:    He has no cervical adenopathy.  Neurological: He is alert and oriented to person, place,  and time. No cranial nerve deficit. He exhibits normal muscle tone. Coordination normal.  Skin: Skin is warm and dry. No rash noted.  Psychiatric: He has a normal mood and affect. His behavior is normal. Judgment and thought content normal.  Nursing note and vitals reviewed.   ED Treatments / Results  DIAGNOSTIC STUDIES:  Oxygen Saturation is 98% on RA, normal by my interpretation.    COORDINATION OF CARE:  12:10 AM Will order CBC, CMP, and urinalysis. Discussed treatment plan with pt at bedside and pt agreed to  plan.  Labs (all labs ordered are listed, but only abnormal results are displayed) Labs Reviewed - No data to display  EKG  EKG Interpretation None       Radiology No results found.  Procedures Procedures (including critical care time)  Medications Ordered in ED Medications - No data to display   Initial Impression / Assessment and Plan / ED Course  I have reviewed the triage vital signs and the nursing notes.  Pertinent labs & imaging results that were available during my care of the patient were reviewed by me and considered in my medical decision making (see chart for details).  Vague complaints of feeling hot - possible early viral illness. Normal exam. We'll check screening labs. He is noted to be significantly hypertensive at triage, but blood pressure has come down somewhat since arriving in his room. Old records are reviewed, and he has no relevant past visits.  Laboratory workup is unremarkable. He does have mild microscopic hematuria which had been present in the past. He is given reassurance of benign workup. He is encouraged to have his blood pressure rechecked as an outpatient.  Final Clinical Impressions(s) / ED Diagnoses   Final diagnoses:  Malaise  Elevated blood-pressure reading without diagnosis of hypertension    New Prescriptions New Prescriptions   No medications on file   I personally performed the services described in this documentation, which was scribed in my presence. The recorded information has been reviewed and is accurate.       Dione Booze, MD 06/09/16 [phone]

## 2016-06-09 LAB — CBC WITH DIFFERENTIAL/PLATELET
BASOS PCT: 0 %
Basophils Absolute: 0 10*3/uL (ref 0.0–0.1)
EOS ABS: 0 10*3/uL (ref 0.0–0.7)
EOS PCT: 1 %
HCT: 46.2 % (ref 39.0–52.0)
Hemoglobin: 16.1 g/dL (ref 13.0–17.0)
LYMPHS ABS: 2.7 10*3/uL (ref 0.7–4.0)
Lymphocytes Relative: 50 %
MCH: 30.6 pg (ref 26.0–34.0)
MCHC: 34.8 g/dL (ref 30.0–36.0)
MCV: 87.7 fL (ref 78.0–100.0)
MONO ABS: 0.3 10*3/uL (ref 0.1–1.0)
MONOS PCT: 6 %
Neutro Abs: 2.3 10*3/uL (ref 1.7–7.7)
Neutrophils Relative %: 43 %
Platelets: 309 10*3/uL (ref 150–400)
RBC: 5.27 MIL/uL (ref 4.22–5.81)
RDW: 13 % (ref 11.5–15.5)
WBC: 5.4 10*3/uL (ref 4.0–10.5)

## 2016-06-09 LAB — COMPREHENSIVE METABOLIC PANEL
ALBUMIN: 3.7 g/dL (ref 3.5–5.0)
ALK PHOS: 92 U/L (ref 38–126)
ALT: 21 U/L (ref 17–63)
AST: 20 U/L (ref 15–41)
Anion gap: 8 (ref 5–15)
BUN: 12 mg/dL (ref 6–20)
CALCIUM: 8.9 mg/dL (ref 8.9–10.3)
CHLORIDE: 105 mmol/L (ref 101–111)
CO2: 27 mmol/L (ref 22–32)
CREATININE: 0.86 mg/dL (ref 0.61–1.24)
GFR calc Af Amer: >60 mL/min (ref >60)
GFR calc non Af Amer: >60 mL/min (ref >60)
GLUCOSE: 98 mg/dL (ref 65–99)
Potassium: 3.6 mmol/L (ref 3.5–5.1)
SODIUM: 140 mmol/L (ref 135–145)
Total Bilirubin: 0.3 mg/dL (ref 0.3–1.2)
Total Protein: 7.4 g/dL (ref 6.5–8.1)

## 2016-06-09 LAB — URINALYSIS, ROUTINE W REFLEX MICROSCOPIC
Bacteria, UA: NONE SEEN
Bilirubin Urine: NEGATIVE
Glucose, UA: NEGATIVE mg/dL
Ketones, ur: NEGATIVE mg/dL
Leukocytes, UA: NEGATIVE
Nitrite: NEGATIVE
Protein, ur: NEGATIVE mg/dL
SPECIFIC GRAVITY, URINE: 1.016 (ref 1.005–1.030)
SQUAMOUS EPITHELIAL / LPF: NONE SEEN
pH: 6 (ref 5.0–8.0)

## 2016-06-09 NOTE — Discharge Instructions (Signed)
Your evaluation did not show any sign of any significant illness. Your blood pressure was slightly elevated. Please have this rechecked over the next week. If it is persistently elevated, you would need to be on medication to control it.

## 2016-06-09 NOTE — ED Notes (Signed)
Pt understood dc material. NAD noted. 

## 2016-09-24 ENCOUNTER — Encounter (HOSPITAL_COMMUNITY): Payer: Self-pay | Admitting: Emergency Medicine

## 2016-09-24 ENCOUNTER — Emergency Department (HOSPITAL_COMMUNITY)
Admission: EM | Admit: 2016-09-24 | Discharge: 2016-09-24 | Disposition: A | Discharge status: Home / Self Care | Payer: Managed Care, Other (non HMO) | Attending: Emergency Medicine | Admitting: Emergency Medicine

## 2016-09-24 DIAGNOSIS — Z7982 Long term (current) use of aspirin: Secondary | ICD-10-CM | POA: Insufficient documentation

## 2016-09-24 DIAGNOSIS — F17210 Nicotine dependence, cigarettes, uncomplicated: Secondary | ICD-10-CM | POA: Insufficient documentation

## 2016-09-24 DIAGNOSIS — H578 Other specified disorders of eye and adnexa: Secondary | ICD-10-CM | POA: Diagnosis present

## 2016-09-24 DIAGNOSIS — H16002 Unspecified corneal ulcer, left eye: Secondary | ICD-10-CM

## 2016-09-24 MED ORDER — TETRACAINE HCL 0.5 % OP SOLN
2.0000 [drp] | Freq: Once | OPHTHALMIC | Status: AC
  Administered 2016-09-24: 2 [drp] via OPHTHALMIC
  Filled 2016-09-24: qty 2

## 2016-09-24 MED ORDER — CIPROFLOXACIN HCL 0.3 % OP OINT: TOPICAL_OINTMENT | OPHTHALMIC | 0 refills | Status: DC

## 2016-09-24 MED ORDER — FLUORESCEIN SODIUM 0.6 MG OP STRP
1.0000 | ORAL_STRIP | Freq: Once | OPHTHALMIC | Status: AC
  Administered 2016-09-24: 1 via OPHTHALMIC
  Filled 2016-09-24: qty 1

## 2016-09-24 NOTE — ED Triage Notes (Signed)
Pt sts left eye redness and irritation x 2 days

## 2016-09-24 NOTE — ED Provider Notes (Signed)
[facility] Provider Note   CSN: [id number] Arrival date & time: 09/24/16  1759   By signing my name below, I, Teofilo PodMatthew P. Jamison, attest that this documentation has been prepared under the direction and in the presence of [place] Russo, [place]C. Electronically Signed: Teofilo PodMatthew P. Jamison, ED Scribe. 09/24/2016. 6:50 PM.   History   Chief Complaint Chief Complaint  Patient presents with  . Eye Pain    The history is provided by the patient. No language interpreter was used.   HPI Comments:  Jesse Pugh is a 41 y.o. male who presents to the Emergency Department complaining of constant left eye pain x 2 days. Pt complains of associated redness, itching and watering to the left eye, and a dull headache. He reports contact with several individuals at work who had conjunctivitis last week. Pt wears contacts, and currently has them in. Denies any sensation of foreign bodies. Pt has taken tylenol with relief for headache, and took benadryl last night with no relief of eye sx. Denies visual disturbance, photophobia, fever.   History reviewed. No pertinent past medical history.  There are no active problems to display for this patient.   History reviewed. No pertinent surgical history.     Home Medications    Prior to Admission medications   Medication Sig Start Date End Date Taking? Authorizing Provider  aspirin 325 MG tablet Take 650 mg by mouth every 6 (six) hours as needed for mild pain.    [provider]  ciprofloxacin (CILOXAN) 0.3 % ophthalmic ointment Apply one-half inch ribbon into lower eyelid 3 times daily for the first 2 days, then 2 times daily for the next 5 days 09/24/16   Russo, [place] N, PA-C  HYDROcodone-acetaminophen (NORCO/VICODIN) 5-325 MG tablet Take 1 tablet by mouth every 6 (six) hours as needed. 02/28/16   Zadie RhineWickline, Donald, MD  ibuprofen (ADVIL,MOTRIN) 600 MG tablet Take 1 tablet (600 mg total) by mouth every 8 (eight) hours as needed. 02/28/16    Zadie RhineWickline, Donald, MD    Family History History reviewed. No pertinent family history.  Social History Social History  Substance Use Topics  . Smoking status: Current Every Day Smoker    Packs/day: 0.50    Types: Cigarettes  . Smokeless tobacco: Never Used  . Alcohol use Yes     Comment: occ     Allergies   Patient has no known allergies.   Review of Systems Review of Systems  Constitutional: Negative for fever.  Eyes: Positive for pain, discharge, redness and itching. Negative for photophobia and visual disturbance.     Physical Exam Updated Vital Signs BP (!) 164/111   Pulse 78   Temp 98.3 F (36.8 C) (Oral)   Resp 16   SpO2 100%   Physical Exam  Constitutional: He appears well-developed and well-nourished. No distress.  HENT:  Head: Normocephalic and atraumatic.  Eyes: EOM and lids are normal. Pupils are equal, round, and reactive to light. Lids are everted and swept, no foreign bodies found. Right conjunctiva is not injected. Left conjunctiva is injected.  Slit lamp exam:      The left eye shows corneal ulcer (superficial) and fluorescein uptake. The left eye shows no foreign body, no hyphema and no anterior chamber bulge.  Left eye with conjunctival injection, sparing limbus. Fluorescein applied; eye examined under slit lamp, crescent-shaped uptake visualized lateral aspect of left cornea. No foreign bodies visualized. Visual acuity 20/20 bilaterally. Negative Seidel sign.   Cardiovascular: Normal rate.   Pulmonary/Chest:  Effort normal.  Abdominal: He exhibits no distension.  Neurological: He is alert.  Skin: Skin is warm and dry.  Psychiatric: He has a normal mood and affect. His behavior is normal.  Nursing note and vitals reviewed.    ED Treatments / Results  DIAGNOSTIC STUDIES:  Oxygen Saturation is 100% on RA, normal by my interpretation.    COORDINATION OF CARE:  6:50 PM Discussed treatment plan with pt at bedside and pt agreed to plan.     Labs (all labs ordered are listed, but only abnormal results are displayed) Labs Reviewed - No data to display  EKG  EKG Interpretation None       Radiology No results found.  Procedures Procedures (including critical care time)  Medications Ordered in ED Medications  fluorescein ophthalmic strip 1 strip (1 strip Left Eye Given 09/24/16 1859)  tetracaine (PONTOCAINE) 0.5 % ophthalmic solution 2 drop (2 drops Left Eye Given 09/24/16 1859)     Initial Impression / Assessment and Plan / ED Course  I have reviewed the triage vital signs and the nursing notes.  Pertinent labs & imaging results that were available during my care of the patient were reviewed by me and considered in my medical decision making (see chart for details).      Pt with corneal ulcer on PE.  No change in vision, acuity equal bilaterally.  Pt is a contact lens wearer.  Exam non-concerning for orbital cellulitis, hyphema. Patient will be discharged home with ciprofloxacin ophthalmic ointment for pseudomonas coverage.   Patient understands to follow up with ophthalmology, & to return to ER if new symptoms develop including change in vision, purulent drainage, or entrapment.  Patient discussed with Dr. Adela Lank, who agrees with care plan.  Discussed results, findings, treatment and follow up. Patient advised of return precautions. Patient verbalized understanding and agreed with plan.   Final Clinical Impressions(s) / ED Diagnoses   Final diagnoses:  Corneal ulcer of left eye    New Prescriptions Discharge Medication List as of 09/24/2016  8:20 PM    START taking these medications   Details  ciprofloxacin (CILOXAN) 0.3 % ophthalmic ointment Apply one-half inch ribbon into lower eyelid 3 times daily for the first 2 days, then 2 times daily for the next 5 days, Print      I personally performed the services described in this documentation, which was scribed in my presence. The recorded information has  been reviewed and is accurate.     Russo, [place] N, PA-C 09/24/16 2139    Russo, [place] N, PA-C 09/25/16 0019    Melene Plan, DO 09/25/16 1310

## 2016-09-24 NOTE — Discharge Instructions (Signed)
Please read instructions below. Apply 1/2 inch ribbon into the lower eyelid 3 times per day for the first 2 days, then 2 times daily for the next 5 days. Schedule an appointment with your ophthalmologist for follow-up. Discard the pair of contact lens you are wearing today. Do not wear contact lenses until after you see your ophthalmologist. Avoid rubbing your eye. Wash your hands frequently.

## 2016-12-21 ENCOUNTER — Encounter (HOSPITAL_COMMUNITY): Payer: Self-pay | Admitting: *Deleted

## 2016-12-21 ENCOUNTER — Emergency Department (HOSPITAL_COMMUNITY)
Admission: EM | Admit: 2016-12-21 | Discharge: 2016-12-21 | Disposition: A | Discharge status: Home / Self Care | Payer: Managed Care, Other (non HMO) | Attending: Emergency Medicine | Admitting: Emergency Medicine

## 2016-12-21 DIAGNOSIS — J029 Acute pharyngitis, unspecified: Secondary | ICD-10-CM | POA: Diagnosis present

## 2016-12-21 DIAGNOSIS — F17210 Nicotine dependence, cigarettes, uncomplicated: Secondary | ICD-10-CM | POA: Insufficient documentation

## 2016-12-21 LAB — RAPID STREP SCREEN (MED CTR MEBANE ONLY): Streptococcus, Group A Screen (Direct): NEGATIVE

## 2016-12-21 NOTE — ED Provider Notes (Signed)
[facility]-EMERGENCY DEPT Provider Note   CSN: [id number] Arrival date & time: 12/21/16  1039     History   Chief Complaint Chief Complaint  Patient presents with  . Sore Throat    HPI Jesse Pugh is a 41 y.o. male no past medical history presenting with a sore throat since Friday. Pain is sharp and aggravated by swallowing. He has tried Tylenol without relief. He states that he may have been coughing a couple days ago. Known ill contacts with people at work. Subjective fever, no chills, no difficulty swallowing, no swelling Denies nausea, vomiting or other symptoms. HPI  History reviewed. No pertinent past medical history.  There are no active problems to display for this patient.   History reviewed. No pertinent surgical history.     Home Medications    Prior to Admission medications   Medication Sig Start Date End Date Taking? Authorizing Provider  aspirin 325 MG tablet Take 650 mg by mouth every 6 (six) hours as needed for mild pain.    [provider]  ciprofloxacin (CILOXAN) 0.3 % ophthalmic ointment Apply one-half inch ribbon into lower eyelid 3 times daily for the first 2 days, then 2 times daily for the next 5 days 09/24/16   Russo, [place] N, PA-C  HYDROcodone-acetaminophen (NORCO/VICODIN) 5-325 MG tablet Take 1 tablet by mouth every 6 (six) hours as needed. 02/28/16   Zadie Rhine, MD  ibuprofen (ADVIL,MOTRIN) 600 MG tablet Take 1 tablet (600 mg total) by mouth every 8 (eight) hours as needed. 02/28/16   Zadie Rhine, MD    Family History No family history on file.  Social History Social History  Substance Use Topics  . Smoking status: Current Every Day Smoker    Packs/day: 0.50    Types: Cigarettes  . Smokeless tobacco: Never Used  . Alcohol use Yes     Comment: occ     Allergies   Patient has no known allergies.   Review of Systems Review of Systems  Constitutional: Positive for fever. Negative for chills.       Subjective  fevers  HENT: Positive for sore throat. Negative for ear pain, facial swelling, sinus pain, sinus pressure, trouble swallowing and voice change.   Respiratory: Negative for cough, shortness of breath, wheezing and stridor.   Cardiovascular: Negative for chest pain and palpitations.  Gastrointestinal: Negative for abdominal pain, nausea and vomiting.  Musculoskeletal: Negative for arthralgias, back pain, myalgias, neck pain and neck stiffness.  Skin: Negative for color change, pallor and rash.  Neurological: Negative for dizziness, syncope, speech difficulty and headaches.     Physical Exam Updated Vital Signs BP (!) 144/91 (BP Location: Left Arm)   Pulse 77   Temp 98.7 F (37.1 C) (Oral)   Resp 18   Ht 6\' 2"  (1.88 m)   Wt 105.7 kg (233 lb)   SpO2 96%   BMI 29.92 kg/m   Physical Exam  Constitutional: He appears well-developed and well-nourished. No distress.  Afebrile, nontoxic-appearing, sitting comfortably in chair in no acute distress.  HENT:  Head: Normocephalic and atraumatic.  Mouth/Throat: Oropharyngeal exudate present.  Tonsillar exudate No gross oral abscess. Uvula is midline, arches are simmetrical and intact. No peritonsilar swelling. No trismus.Tolerating oral secretions. No concern for ludwig's angina.   Eyes: Conjunctivae are normal. Right eye exhibits no discharge. Left eye exhibits no discharge.  Neck: Normal range of motion. Neck supple.  Mild cervical adenopathy bilaterally  Cardiovascular: Normal rate, regular rhythm and normal heart sounds.  No murmur heard. Pulmonary/Chest: Effort normal and breath sounds normal. No respiratory distress. He has no wheezes. He has no rales.  Abdominal: He exhibits no distension.  Musculoskeletal: He exhibits no edema.  Lymphadenopathy:    He has cervical adenopathy.  Neurological: He is alert.  Skin: Skin is warm and dry. No rash noted. He is not diaphoretic. No erythema. No pallor.  Psychiatric: He has a normal mood  and affect.  Nursing note and vitals reviewed.    ED Treatments / Results  Labs (all labs ordered are listed, but only abnormal results are displayed) Labs Reviewed  RAPID STREP SCREEN (NOT AT [facility])  CULTURE, GROUP A STREP [facility])    EKG  EKG Interpretation None       Radiology No results found.  Procedures Procedures (including critical care time)  Medications Ordered in ED Medications - No data to display   Initial Impression / Assessment and Plan / ED Course  I have reviewed the triage vital signs and the nursing notes.  Pertinent labs & imaging results that were available during my care of the patient were reviewed by me and considered in my medical decision making (see chart for details).    Patient presents with sore throat, negative strep, tonsillar exudate on exam and mild cervical adenopathy.  Patient is tolerating oral secretions and fluids. Otherwise well-appearing, nontoxic and afebrile.  Known ill contacts. Viral pharyngitis, will treat with conservative symptomatic relief. These were discussed with patient.  Encouraged patient's stay well hydrated and PCP follow-up to establish care.  Discussed strict return precautions and advised to return to the emergency department if experiencing any new or worsening symptoms. Instructions were understood and patient agreed with discharge plan. Final Clinical Impressions(s) / ED Diagnoses   Final diagnoses:  Viral pharyngitis    New Prescriptions New Prescriptions   No medications on file     Gregary Cromer 12/21/16 1326    Raeford Razor, MD 12/30/16 [phone]

## 2016-12-21 NOTE — ED Triage Notes (Signed)
Pt here with sore throat.  Pain most when swallowing.  This began Friday, no fever

## 2016-12-21 NOTE — ED Notes (Signed)
Declined W/C at D/C and was escorted to lobby by RN. 

## 2016-12-21 NOTE — Discharge Instructions (Signed)
As discussed, please make sure that you stay well-hydrated drinking enough fluids to keep your urine clear.  Use tea with honey, cough drops, throat sprays over-the-counter, ibuprofen or Tylenol to help sooth your throat.  Return if you experience any difficulty swallowing, drooling, difficult breathing, any worsening or new concerning symptoms in the meantime.

## 2016-12-23 LAB — CULTURE, GROUP A STREP (THRC)

## 2017-04-03 DIAGNOSIS — S20212A Contusion of left front wall of thorax, initial encounter: Secondary | ICD-10-CM | POA: Insufficient documentation

## 2017-04-03 DIAGNOSIS — Z79899 Other long term (current) drug therapy: Secondary | ICD-10-CM | POA: Insufficient documentation

## 2017-04-03 DIAGNOSIS — Y92411 Interstate highway as the place of occurrence of the external cause: Secondary | ICD-10-CM | POA: Diagnosis not present

## 2017-04-03 DIAGNOSIS — S161XXA Strain of muscle, fascia and tendon at neck level, initial encounter: Secondary | ICD-10-CM | POA: Insufficient documentation

## 2017-04-03 DIAGNOSIS — Z5321 Procedure and treatment not carried out due to patient leaving prior to being seen by health care provider: Secondary | ICD-10-CM | POA: Insufficient documentation

## 2017-04-03 DIAGNOSIS — M25512 Pain in left shoulder: Secondary | ICD-10-CM | POA: Diagnosis present

## 2017-04-03 DIAGNOSIS — F17210 Nicotine dependence, cigarettes, uncomplicated: Secondary | ICD-10-CM | POA: Insufficient documentation

## 2017-04-03 DIAGNOSIS — Z7982 Long term (current) use of aspirin: Secondary | ICD-10-CM | POA: Insufficient documentation

## 2017-04-04 ENCOUNTER — Emergency Department (HOSPITAL_COMMUNITY)
Admission: EM | Admit: 2017-04-04 | Discharge: 2017-04-05 | Disposition: A | Discharge status: Home / Self Care | Payer: Managed Care, Other (non HMO) | Source: Home / Self Care | Attending: Emergency Medicine | Admitting: Emergency Medicine

## 2017-04-04 ENCOUNTER — Encounter (HOSPITAL_COMMUNITY): Payer: Self-pay | Admitting: Emergency Medicine

## 2017-04-04 ENCOUNTER — Emergency Department (HOSPITAL_COMMUNITY): Payer: Managed Care, Other (non HMO)

## 2017-04-04 ENCOUNTER — Other Ambulatory Visit: Payer: Self-pay

## 2017-04-04 ENCOUNTER — Emergency Department (HOSPITAL_COMMUNITY)
Admission: EM | Admit: 2017-04-04 | Discharge: 2017-04-04 | Discharge status: Against Medical Advice | Payer: Managed Care, Other (non HMO) | Attending: Emergency Medicine | Admitting: Emergency Medicine

## 2017-04-04 DIAGNOSIS — Y999 Unspecified external cause status: Secondary | ICD-10-CM

## 2017-04-04 DIAGNOSIS — S161XXA Strain of muscle, fascia and tendon at neck level, initial encounter: Secondary | ICD-10-CM | POA: Insufficient documentation

## 2017-04-04 DIAGNOSIS — Y939 Activity, unspecified: Secondary | ICD-10-CM | POA: Insufficient documentation

## 2017-04-04 DIAGNOSIS — S20212A Contusion of left front wall of thorax, initial encounter: Secondary | ICD-10-CM | POA: Insufficient documentation

## 2017-04-04 DIAGNOSIS — Z79899 Other long term (current) drug therapy: Secondary | ICD-10-CM

## 2017-04-04 DIAGNOSIS — Y92411 Interstate highway as the place of occurrence of the external cause: Secondary | ICD-10-CM | POA: Insufficient documentation

## 2017-04-04 DIAGNOSIS — F17210 Nicotine dependence, cigarettes, uncomplicated: Secondary | ICD-10-CM

## 2017-04-04 DIAGNOSIS — Z7982 Long term (current) use of aspirin: Secondary | ICD-10-CM

## 2017-04-04 MED ORDER — OXYCODONE-ACETAMINOPHEN 5-325 MG PO TABS
1.0000 | ORAL_TABLET | Freq: Once | ORAL | Status: AC
  Administered 2017-04-04: 1 via ORAL
  Filled 2017-04-04: qty 1

## 2017-04-04 MED ORDER — IBUPROFEN 400 MG PO TABS
400.0000 mg | ORAL_TABLET | Freq: Once | ORAL | Status: AC
  Administered 2017-04-04: 400 mg via ORAL
  Filled 2017-04-04: qty 1

## 2017-04-04 NOTE — ED Notes (Addendum)
Called for room in peds for PA to see. No answer in lobby.
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
## 2017-04-04 NOTE — ED Notes (Signed)
Pt called to room x2, no answer.

## 2017-04-04 NOTE — ED Notes (Signed)
Pt called for a room and no answer in the lobby.
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
## 2017-04-05 MED ORDER — CYCLOBENZAPRINE HCL 10 MG PO TABS: 10.0000 mg | ORAL_TABLET | Freq: Two times a day (BID) | ORAL | 0 refills | Status: DC | PRN
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
## 2017-08-17 ENCOUNTER — Other Ambulatory Visit: Payer: Self-pay

## 2017-08-17 ENCOUNTER — Emergency Department (HOSPITAL_COMMUNITY)
Admission: EM | Admit: 2017-08-17 | Discharge: 2017-08-17 | Disposition: A | Discharge status: Home / Self Care | Payer: Managed Care, Other (non HMO) | Attending: Physician Assistant | Admitting: Physician Assistant

## 2017-08-17 DIAGNOSIS — F17210 Nicotine dependence, cigarettes, uncomplicated: Secondary | ICD-10-CM | POA: Insufficient documentation

## 2017-08-17 DIAGNOSIS — J020 Streptococcal pharyngitis: Secondary | ICD-10-CM | POA: Insufficient documentation

## 2017-08-17 LAB — GROUP A STREP BY PCR: Group A Strep by PCR: DETECTED — AB

## 2017-08-17 MED ORDER — IBUPROFEN 400 MG PO TABS
600.0000 mg | ORAL_TABLET | Freq: Once | ORAL | Status: AC
  Administered 2017-08-17: 09:00:00 600 mg via ORAL
  Filled 2017-08-17: qty 1

## 2017-08-17 MED ORDER — PENICILLIN G BENZATHINE 1200000 UNIT/2ML IM SUSP
1.2000 10*6.[IU] | Freq: Once | INTRAMUSCULAR | Status: AC
  Administered 2017-08-17: 1.2 10*6.[IU] via INTRAMUSCULAR
  Filled 2017-08-17: qty 2

## 2017-08-17 MED ORDER — DEXAMETHASONE 4 MG PO TABS
8.0000 mg | ORAL_TABLET | Freq: Once | ORAL | Status: AC
  Administered 2017-08-17: 8 mg via ORAL
  Filled 2017-08-17: qty 2

## 2017-08-17 NOTE — ED Provider Notes (Signed)
[facility] EMERGENCY DEPARTMENT Provider Note   CSN: [id number] Arrival date & time: 08/17/17  4098     History   Chief Complaint Chief Complaint  Patient presents with  . Sore Throat    HPI Jesse Pugh is a 42 y.o. male without significant past medical history, presenting to the ED with 2 days of persistent sore throat.  Patient states he has pain with swallowing, and subjective fever.  Reports immittance mild dry cough and left ear pain.  States his nose is both runny and congested.  Has been taking ibuprofen and TheraFlu for symptoms.  Denies ability to swallow, difficulty breathing, or any other complaints.  No recent antibiotics.  States his girlfriend was recently sick w similar symptoms.  The history is provided by the patient.    No past medical history on file.  There are no active problems to display for this patient.   No past surgical history on file.      Home Medications    Prior to Admission medications   Medication Sig Start Date End Date Taking? Authorizing Provider  aspirin 325 MG tablet Take 650 mg by mouth every 6 (six) hours as needed for mild pain.    [provider]  ciprofloxacin (CILOXAN) 0.3 % ophthalmic ointment Apply one-half inch ribbon into lower eyelid 3 times daily for the first 2 days, then 2 times daily for the next 5 days 09/24/16   Laverle Pillard, [place] N, PA-C  cyclobenzaprine (FLEXERIL) 10 MG tablet Take 1 tablet (10 mg total) by mouth 2 (two) times daily as needed for muscle spasms. 04/05/17   McDonald, Mia A, PA-C  HYDROcodone-acetaminophen (NORCO/VICODIN) 5-325 MG tablet Take 1 tablet by mouth every 6 (six) hours as needed. 02/28/16   Zadie Rhine, MD  ibuprofen (ADVIL,MOTRIN) 600 MG tablet Take 1 tablet (600 mg total) by mouth every 8 (eight) hours as needed. 02/28/16   Zadie Rhine, MD    Family History No family history on file.  Social History Social History   Tobacco Use  . Smoking status:  Current Every Day Smoker    Packs/day: 0.50    Types: Cigarettes  . Smokeless tobacco: Never Used  Substance Use Topics  . Alcohol use: Yes    Comment: occ  . Drug use: No     Allergies   Patient has no known allergies.   Review of Systems Review of Systems  Constitutional: Positive for fever (subjective). Negative for chills.  HENT: Positive for congestion, ear pain, rhinorrhea and sore throat. Negative for trouble swallowing and voice change.   Respiratory: Positive for cough. Negative for shortness of breath.      Physical Exam Updated Vital Signs BP 123/74 (BP Location: Right Arm)   Pulse 98   Temp 100 F (37.8 C) (Oral)   Resp 16   SpO2 100%   Physical Exam  Constitutional: He appears well-developed and well-nourished.  Non-toxic appearance. He does not appear ill.  HENT:  Head: Normocephalic and atraumatic.  Right Ear: Tympanic membrane and ear canal normal.  Left Ear: Tympanic membrane and ear canal normal.  Mouth/Throat: Uvula is midline. No uvula swelling. Posterior oropharyngeal erythema present. No tonsillar abscesses.  tonsillar edema, no exudates.  Eyes: Conjunctivae are normal.  Neck: Normal range of motion. Neck supple.  Cardiovascular: Normal rate, regular rhythm, normal heart sounds and intact distal pulses.  Pulmonary/Chest: Effort normal and breath sounds normal. No stridor. No respiratory distress. He has no wheezes. He has no rales.  Lymphadenopathy:    He has cervical adenopathy (anterior).  Psychiatric: He has a normal mood and affect. His behavior is normal.  Nursing note and vitals reviewed.    ED Treatments / Results  Labs (all labs ordered are listed, but only abnormal results are displayed) Labs Reviewed  GROUP A STREP BY PCR - Abnormal; Notable for the following components:      Result Value   Group A Strep by PCR DETECTED (*)    All other components within normal limits    EKG None  Radiology No results  found.  Procedures Procedures (including critical care time)  Medications Ordered in ED Medications  penicillin g benzathine (BICILLIN LA) 1200000 UNIT/2ML injection 1.2 Million Units (has no administration in time range)  ibuprofen (ADVIL,MOTRIN) tablet 600 mg (600 mg Oral Given 08/17/17 0917)  dexamethasone (DECADRON) tablet 8 mg (8 mg Oral Given 08/17/17 1011)     Initial Impression / Assessment and Plan / ED Course  I have reviewed the triage vital signs and the nursing notes.  Pertinent labs & imaging results that were available during my care of the patient were reviewed by me and considered in my medical decision making (see chart for details).    Pt with history and presentation consistent with strep pharyngitis. Presentation non concerning for PTA or infxn spread to soft tissue. No trismus or uvula deviation. Tolerating secretions. . Pt able to drink water in ED without difficulty with intact air way. Treated in the ED with steroids, NSAIDs, and PCN IM.  Pt appears mildly dehydrated, discussed importance of water rehydration.  Recommended PCP follow up. Specific return precautions discussed  Discussed results, findings, treatment and follow up. Patient advised of return precautions. Patient verbalized understanding and agreed with plan.  Final Clinical Impressions(s) / ED Diagnoses   Final diagnoses:  Strep pharyngitis    ED Discharge Orders    None       Anevay Campanella, [place] N, PA-C 08/17/17 1038    Mackuen, Cindee Saltourteney Lyn, MD 08/26/17 [phone]

## 2017-08-17 NOTE — Discharge Instructions (Signed)
Please read instructions below. You have been treated for strep throat with penicillin today. Take 600 mg of ibuprofen/Advil every 6 hours as needed for pain and fever.  You can take Tylenol in addition to this medication for additional pain relief. It is important that you stay hydrated, and drink plenty of water. Follow-up with your primary care provider if symptoms persist. Return to the ER for inability to swallow liquids, difficulty breathing, or new or concerning symptoms.

## 2017-08-17 NOTE — ED Triage Notes (Signed)
Pt has sore throat x 3 days with left ear pain. Pt has swollen tonsils with difficulty swallowing. No sob or trouble breathing.

## 2017-08-18 ENCOUNTER — Telehealth: Payer: Self-pay | Admitting: *Deleted

## 2017-08-18 NOTE — Telephone Encounter (Signed)
Post ED Visit - Positive Culture Follow-up  Culture report reviewed by antimicrobial stewardship pharmacist:  []  Enzo BiNathan Batchelder, Pharm.D. []  Celedonio MiyamotoJeremy Frens, Pharm.D., BCPS AQ-ID []  Garvin FilaMike Maccia, Pharm.D., BCPS []  Georgina PillionElizabeth Martin, [address]Pharm[address]D., BCPS []  La EscondidaMinh Pham, [place]arm.D., BCPS, AAHIVP [x]  Estella HuskMichelle Turner, Pharm.D., BCPS, AAHIVP []  Lysle Pearlachel Rumbarger, PharmD, BCPS []  Blake DivineShannon Parkey, PharmD []  Pollyann SamplesAndy Johnston, PharmD, BCPS  Positive strep culture Treated with Penicillin IM in ED, organism sensitive to the same and no further patient follow-up is required at this time.  Virl AxeRobertson, Bellamarie Pflug [facility] 08/18/2017, 10:03 AM

## 2018-01-25 ENCOUNTER — Emergency Department
Admission: EM | Admit: 2018-01-25 | Discharge: 2018-01-25 | Disposition: A | Discharge status: Home / Self Care | Payer: Self-pay | Attending: Emergency Medicine | Admitting: Emergency Medicine

## 2018-01-25 ENCOUNTER — Other Ambulatory Visit: Payer: Self-pay

## 2018-01-25 ENCOUNTER — Encounter: Payer: Self-pay | Admitting: Emergency Medicine

## 2018-01-25 DIAGNOSIS — F17210 Nicotine dependence, cigarettes, uncomplicated: Secondary | ICD-10-CM | POA: Insufficient documentation

## 2018-01-25 DIAGNOSIS — J020 Streptococcal pharyngitis: Secondary | ICD-10-CM | POA: Insufficient documentation

## 2018-01-25 DIAGNOSIS — H1089 Other conjunctivitis: Secondary | ICD-10-CM | POA: Insufficient documentation

## 2018-01-25 DIAGNOSIS — H1032 Unspecified acute conjunctivitis, left eye: Secondary | ICD-10-CM

## 2018-01-25 MED ORDER — AMOXICILLIN 875 MG PO TABS: 875.0000 mg | ORAL_TABLET | Freq: Two times a day (BID) | ORAL | 0 refills | Status: AC

## 2018-01-25 MED ORDER — DEXAMETHASONE SODIUM PHOSPHATE 10 MG/ML IJ SOLN
10.0000 mg | Freq: Once | INTRAMUSCULAR | Status: AC
  Administered 2018-01-25: 10 mg via INTRAMUSCULAR
  Filled 2018-01-25: qty 1

## 2018-01-25 MED ORDER — POLYMYXIN B-TRIMETHOPRIM 10000-0.1 UNIT/ML-% OP SOLN: 1.0000 [drp] | OPHTHALMIC | 0 refills | Status: AC

## 2018-01-25 NOTE — ED Triage Notes (Signed)
Reddened L sclera and sore throat x 2 days.

## 2018-01-25 NOTE — ED Provider Notes (Signed)
[facility] Emergency Department Provider Note  ____________________________________________  Time seen: Approximately 7:31 PM  I have reviewed the triage vital signs and the nursing notes.   HISTORY  Chief Complaint Sore and Eye Problem    HPI Jesse Pugh is a 42 y.o. male presents to the emergency department with pharyngitis and left eye conjunctivitis for the past 2 to 3 days.  Patient reports that he slept in his contact lenses about a week ago.  Patient denies changes in vision but has had increased tearing and matting and crusting of left eye.  Patient is also had 8 out of 10 pharyngitis.  He is tolerating fluids by mouth and his own secretions.  No changes in voice.  Patient has also had associated headache.  He has had low-grade fever at home with chills.  Patient denies dysuria or penile discharge.  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   History reviewed. No pertinent past medical history.  There are no active problems to display for this patient.   History reviewed. No pertinent surgical history.  Prior to Admission medications   Medication Sig Start Date End Date Taking? Authorizing Provider  amoxicillin (AMOXIL) 875 MG tablet Take 1 tablet (875 mg total) by mouth 2 (two) times daily for 10 days. 01/25/18 02/04/18  Orvil FeilWoods, Rushi Chasen M, PA-C  aspirin 325 MG tablet Take 650 mg by mouth every 6 (six) hours as needed for mild pain.    [provider]  ciprofloxacin (CILOXAN) 0.3 % ophthalmic ointment Apply one-half inch ribbon into lower eyelid 3 times daily for the first 2 days, then 2 times daily for the next 5 days 09/24/16   Robinson, [place] N, PA-C  cyclobenzaprine (FLEXERIL) 10 MG tablet Take 1 tablet (10 mg total) by mouth 2 (two) times daily as needed for muscle spasms. 04/05/17   McDonald, Mia A, PA-C  HYDROcodone-acetaminophen (NORCO/VICODIN) 5-325 MG tablet Take 1 tablet by mouth every 6 (six) hours as needed. 02/28/16   Zadie RhineWickline, Donald, MD   ibuprofen (ADVIL,MOTRIN) 600 MG tablet Take 1 tablet (600 mg total) by mouth every 8 (eight) hours as needed. 02/28/16   Zadie RhineWickline, Donald, MD  trimethoprim-polymyxin b (POLYTRIM) ophthalmic solution Place 1 drop into the left eye every 4 (four) hours for 7 days. 01/25/18 02/01/18  Orvil FeilWoods, Gizella Belleville M, PA-C    Allergies Patient has no known allergies.  No family history on file.  Social History Social History   Tobacco Use  . Smoking status: Current Every Day Smoker    Packs/day: 0.10    Types: Cigarettes  . Smokeless tobacco: Never Used  Substance Use Topics  . Alcohol use: Yes    Comment: occ  . Drug use: No     Review of Systems  Constitutional: Patient has had fever and chills. Eyes: Patient has left eye conjunctivitis. ENT: Patient has pharyngitis. Cardiovascular: no chest pain. Respiratory: no cough. No SOB. Gastrointestinal: No abdominal pain.  No nausea, no vomiting.  No diarrhea.  No constipation. Genitourinary: Negative for dysuria. No hematuria Musculoskeletal: Negative for musculoskeletal pain. Skin: Negative for rash, abrasions, lacerations, ecchymosis. Neurological: Negative for headaches, focal weakness or numbness.   ____________________________________________   PHYSICAL EXAM:  VITAL SIGNS: ED Triage Vitals [01/25/18 1718]  Enc Vitals Group     BP (!) 167/94     Pulse Rate 91     Resp 18     Temp 99.1 F (37.3 C)     Temp Source Oral     SpO2 97 %  Weight 250 lb (113.4 kg)     Height 6\' 1"  (1.854 m)     Head Circumference      Peak Flow      Pain Score 10     Pain Loc      Pain Edu?      Excl. in GC?      Constitutional: Alert and oriented. Well appearing and in no acute distress. Eyes: Left eye conjunctivitis visualized. PERRL. EOMI. Head: Atraumatic. ENT:      Ears: TMs are pearly.      Nose: No congestion/rhinnorhea.      Mouth/Throat: Mucous membranes are moist.  Posterior pharynx is erythematous with petechiae but no tonsillar  exudate.  Uvula is midline. Neck: No stridor.  No cervical spine tenderness to palpation. Hematological/Lymphatic/Immunilogical: Palpable cervical lymphadenopathy. Cardiovascular: Normal rate, regular rhythm. Normal S1 and S2.  Good peripheral circulation. Respiratory: Normal respiratory effort without tachypnea or retractions. Lungs CTAB. Good air entry to the bases with no decreased or absent breath sounds. Gastrointestinal: Bowel sounds 4 quadrants. Soft and nontender to palpation. No guarding or rigidity. No palpable masses. No distention. No CVA tenderness. Skin:  Skin is warm, dry and intact. No rash noted. Psychiatric: Mood and affect are normal. Speech and behavior are normal. Patient exhibits appropriate insight and judgement.   ____________________________________________   LABS (all labs ordered are listed, but only abnormal results are displayed)  Labs Reviewed - No data to display ____________________________________________  EKG   ____________________________________________  RADIOLOGY   No results found.  ____________________________________________    PROCEDURES  Procedure(s) performed:    Procedures    Medications  dexamethasone (DECADRON) injection 10 mg (10 mg Intramuscular Given 01/25/18 1754)     ____________________________________________   INITIAL IMPRESSION / ASSESSMENT AND PLAN / ED COURSE  Pertinent labs & imaging results that were available during my care of the patient were reviewed by me and considered in my medical decision making (see chart for details).  Review of the [place] CSRS was performed in accordance of the [facility] prior to dispensing any controlled drugs.      Assessment and plan Strep throat Conjunctivitis Patient presents to the emergency department with pharyngitis and left eye conjunctivitis.  Differential diagnosis originally included streptococcal pharyngitis, bacterial conjunctivitis and Chlamydia.  Patient denies  sexual promiscuity or concern for STDs.  Absence of rhinorrhea, congestion and nonproductive cough decreases suspicion for adenovirus.  Patient has headache and anterior cervical lymphadenopathy, increasing suspicion for strep throat.  Patient was treated empirically with amoxicillin for strep pharyngitis and Polytrim for bacterial conjunctivitis.  He was advised to follow-up with primary care as needed.  All patient questions were answered.     ____________________________________________  FINAL CLINICAL IMPRESSION(S) / ED DIAGNOSES  Final diagnoses:  Acute bacterial conjunctivitis of left eye  Strep throat      NEW MEDICATIONS STARTED DURING THIS VISIT:  ED Discharge Orders         Ordered    trimethoprim-polymyxin b (POLYTRIM) ophthalmic solution  Every 4 hours     01/25/18 1744    amoxicillin (AMOXIL) 875 MG tablet  2 times daily     01/25/18 1750              This chart was dictated using voice recognition software/Dragon. Despite best efforts to proofread, errors can occur which can change the meaning. Any change was purely unintentional.    Orvil Feil, PA-C 01/25/18 1936    Minna Antis, MD 01/25/18 [phone]

## 2018-01-25 NOTE — ED Notes (Signed)
See triage note  Presents with sore throat which started for couple of days  Also has redness to left eye  Denies any injury

## 2018-05-09 ENCOUNTER — Other Ambulatory Visit: Payer: Self-pay

## 2018-05-09 ENCOUNTER — Emergency Department
Admission: EM | Admit: 2018-05-09 | Discharge: 2018-05-09 | Disposition: A | Discharge status: Home / Self Care | Payer: Self-pay | Attending: Emergency Medicine | Admitting: Emergency Medicine

## 2018-05-09 ENCOUNTER — Encounter: Payer: Self-pay | Admitting: Emergency Medicine

## 2018-05-09 DIAGNOSIS — F17210 Nicotine dependence, cigarettes, uncomplicated: Secondary | ICD-10-CM | POA: Insufficient documentation

## 2018-05-09 DIAGNOSIS — S0501XA Injury of conjunctiva and corneal abrasion without foreign body, right eye, initial encounter: Secondary | ICD-10-CM | POA: Insufficient documentation

## 2018-05-09 DIAGNOSIS — Y939 Activity, unspecified: Secondary | ICD-10-CM | POA: Insufficient documentation

## 2018-05-09 DIAGNOSIS — Z79899 Other long term (current) drug therapy: Secondary | ICD-10-CM | POA: Insufficient documentation

## 2018-05-09 DIAGNOSIS — Y929 Unspecified place or not applicable: Secondary | ICD-10-CM | POA: Insufficient documentation

## 2018-05-09 DIAGNOSIS — Z7982 Long term (current) use of aspirin: Secondary | ICD-10-CM | POA: Insufficient documentation

## 2018-05-09 DIAGNOSIS — Y999 Unspecified external cause status: Secondary | ICD-10-CM | POA: Insufficient documentation

## 2018-05-09 DIAGNOSIS — X58XXXA Exposure to other specified factors, initial encounter: Secondary | ICD-10-CM | POA: Insufficient documentation

## 2018-05-09 MED ORDER — FLUORESCEIN SODIUM 1 MG OP STRP
1.0000 | ORAL_STRIP | Freq: Once | OPHTHALMIC | Status: AC
Start: 2018-05-09 — End: 2018-05-09
  Administered 2018-05-09: 1 via OPHTHALMIC
  Filled 2018-05-09: qty 1

## 2018-05-09 MED ORDER — TETRACAINE HCL 0.5 % OP SOLN
1.0000 [drp] | Freq: Once | OPHTHALMIC | Status: AC
  Administered 2018-05-09: 1 [drp] via OPHTHALMIC
  Filled 2018-05-09: qty 4

## 2018-05-09 MED ORDER — HYDROCODONE-ACETAMINOPHEN 5-325 MG PO TABS
1.0000 | ORAL_TABLET | Freq: Once | ORAL | Status: AC
  Administered 2018-05-09: 1 via ORAL
  Filled 2018-05-09: qty 1

## 2018-05-09 MED ORDER — CIPROFLOXACIN HCL 0.3 % OP OINT: TOPICAL_OINTMENT | OPHTHALMIC | 0 refills | Status: AC

## 2018-05-09 MED ORDER — HYDROCODONE-ACETAMINOPHEN 5-325 MG PO TABS: 1.0000 | ORAL_TABLET | Freq: Four times a day (QID) | ORAL | 0 refills | Status: DC | PRN

## 2018-05-09 MED ORDER — EYE WASH OPHTH SOLN
1.0000 [drp] | OPHTHALMIC | Status: DC | PRN
  Filled 2018-05-09: qty 118

## 2018-05-09 NOTE — ED Triage Notes (Signed)
Pt to ED via POV c/o right eye pain. Pt states that pain started yesterday. Pt states that he had clear drainage from eye yesterday but none today. Pt is in NAD at this time.

## 2018-05-09 NOTE — Discharge Instructions (Addendum)
Follow-up with [facility] or your ophthalmologist tomorrow for reevaluation.  Begin using eye ointment which you have used in the past beginning today.  Take Norco only if needed for pain.  Also wear sunglasses to protect your eyes from light because it will be sensitive.  It is important that this abrasion heals as it is in the center of your eye.  Do not put your contacts and until you have been seen by your ophthalmologist.

## 2018-05-09 NOTE — ED Notes (Signed)
Pt instructed to follow up with eye doctor tomorrow and to wear sunglasses to protect from light sensitivity. Pt verbalized understanding.

## 2018-05-09 NOTE — ED Provider Notes (Signed)
[facility] Emergency Department Provider Note   ____________________________________________   First MD Initiated Contact with Patient 05/09/18 1130     (approximate)  I have reviewed the triage vital signs and the nursing notes.   HISTORY  Chief Complaint Eye Pain   HPI Jesse Pugh is a 43 y.o. male presents to the ED with complaint of right eye pain.  Patient states that he wears contacts on a daily basis and occasionally wears them when he sleeps at night.  He states that he cleans them but not as often his he should.  He states that yesterday he began having problems.  He is unaware of any injury and denies the sensation of foreign body.  Pain is worse in bright light.  History reviewed. No pertinent past medical history.  There are no active problems to display for this patient.   History reviewed. No pertinent surgical history.  Prior to Admission medications   Medication Sig Start Date End Date Taking? Authorizing Provider  aspirin 325 MG tablet Take 650 mg by mouth every 6 (six) hours as needed for mild pain.    [provider]  ciprofloxacin (CILOXAN) 0.3 % ophthalmic ointment Apply one-half inch ribbon into lower eyelid 3 times daily for the first 2 days, then 2 times daily for the next 5 days 05/09/18   Tommi RumpsSummers, Fabian Coca L, PA-C  HYDROcodone-acetaminophen (NORCO/VICODIN) 5-325 MG tablet Take 1 tablet by mouth every 6 (six) hours as needed for moderate pain. 05/09/18   Tommi RumpsSummers, Ethelean Colla L, PA-C  ibuprofen (ADVIL,MOTRIN) 600 MG tablet Take 1 tablet (600 mg total) by mouth every 8 (eight) hours as needed. 02/28/16   Zadie RhineWickline, Donald, MD    Allergies Patient has no known allergies.  No family history on file.  Social History Social History   Tobacco Use  . Smoking status: Current Every Day Smoker    Packs/day: 0.10    Types: Cigarettes  . Smokeless tobacco: Never Used  Substance Use Topics  . Alcohol use: Yes    Comment: occ  .  Drug use: No    Review of Systems Constitutional: No fever/chills Eyes: Right eye pain. ENT: No sore throat. Cardiovascular: Denies chest pain. Respiratory: Denies shortness of breath. Musculoskeletal: Negative for back pain. Skin: Negative for rash. Neurological: Negative for headaches, focal weakness or numbness. ____________________________________________   PHYSICAL EXAM:  VITAL SIGNS: ED Triage Vitals  Enc Vitals Group     BP 05/09/18 1053 (!) 155/98     Pulse Rate 05/09/18 1053 80     Resp 05/09/18 1053 14     Temp 05/09/18 1053 (!) 97.5 F (36.4 C)     Temp Source 05/09/18 1053 Oral     SpO2 05/09/18 1053 97 %     Weight 05/09/18 1053 235 lb (106.6 kg)     Height 05/09/18 1053 6\' 2"  (1.88 m)     Head Circumference --      Peak Flow --      Pain Score 05/09/18 1056 8     Pain Loc --      Pain Edu? --      Excl. in GC? --    Constitutional: Alert and oriented. Well appearing and in no acute distress. Eyes: Right conjunctiva is moderately injected without exudate or drainage noted.  Patient is photophobic.  PERRL. EOMI without increased pain.  Tetracaine was placed in the right eye.  Right eyelid was inverted and no foreign body was noted.  Fluorescein stain  was placed and there was a vertical small corneal abrasion noted mid pupil area.  No foreign body was noted.  Visual acuity was obtained. Head: Atraumatic. Nose: No congestion/rhinnorhea. Neck: No stridor.   Cardiovascular: Normal rate, regular rhythm. Grossly normal heart sounds.  Good peripheral circulation. Respiratory: Normal respiratory effort.  No retractions. Lungs CTAB. Neurologic:  Normal speech and language. No gross focal neurologic deficits are appreciated. No gait instability. Skin:  Skin is warm, dry and intact. No rash noted. Psychiatric: Mood and affect are normal. Speech and behavior are normal.  ____________________________________________   LABS (all labs ordered are listed, but only  abnormal results are displayed)  Labs Reviewed - No data to display   PROCEDURES  Procedure(s) performed: None  Procedures  Critical Care performed: No  ____________________________________________   INITIAL IMPRESSION / ASSESSMENT AND PLAN / ED COURSE  As part of my medical decision making, I reviewed the following data within the electronic MEDICAL RECORD NUMBER Notes from prior ED visits and [place] Controlled Substance Database  Patient presents to the ED with complaint of right eye pain.  Patient is a contact user and states that sometimes he wears them at night to sleep.  He also admits that he is not clean them as he should.  He denies any injury or foreign sensation to his right eye.  Visual acuity was obtained.  Physical exam shows a corneal abrasion mid pupil.  Patient was placed on ciprofloxacin ophthalmic ointment as he has been on this in the past for similar reason.  Patient was also given Norco every 6 hours as needed for pain.  He was given precautions of protecting his eye from bright light and did not use his contacts until completely healed.  He states he has an appointment with his doctor tomorrow.  He was also given information about [facility] and contact information if he cannot be seen.  He is to return to the emergency department if any severe worsening of his symptoms.  ____________________________________________   FINAL CLINICAL IMPRESSION(S) / ED DIAGNOSES  Final diagnoses:  Abrasion of right cornea, initial encounter     ED Discharge Orders         Ordered    HYDROcodone-acetaminophen (NORCO/VICODIN) 5-325 MG tablet  Every 6 hours PRN     05/09/18 1321    ciprofloxacin (CILOXAN) 0.3 % ophthalmic ointment     05/09/18 1321           Note:  This document was prepared using Dragon voice recognition software and may include unintentional dictation errors.    Tommi Rumps, PA-C 05/09/18 1610    Minna Antis, MD 05/10/18 1429

## 2018-11-21 ENCOUNTER — Other Ambulatory Visit: Payer: Self-pay

## 2018-11-21 ENCOUNTER — Emergency Department
Admission: EM | Admit: 2018-11-21 | Discharge: 2018-11-21 | Disposition: A | Discharge status: Home / Self Care | Payer: Self-pay | Attending: Emergency Medicine | Admitting: Emergency Medicine

## 2018-11-21 ENCOUNTER — Encounter: Payer: Self-pay | Admitting: Emergency Medicine

## 2018-11-21 DIAGNOSIS — F17210 Nicotine dependence, cigarettes, uncomplicated: Secondary | ICD-10-CM | POA: Insufficient documentation

## 2018-11-21 DIAGNOSIS — J020 Streptococcal pharyngitis: Secondary | ICD-10-CM | POA: Insufficient documentation

## 2018-11-21 LAB — GROUP A STREP BY PCR: Group A Strep by PCR: NOT DETECTED

## 2018-11-21 MED ORDER — ACETAMINOPHEN 325 MG PO TABS
650.0000 mg | ORAL_TABLET | Freq: Once | ORAL | Status: AC | PRN
  Administered 2018-11-21: 650 mg via ORAL
  Filled 2018-11-21: qty 2

## 2018-11-21 MED ORDER — AMOXICILLIN 500 MG PO CAPS
1000.0000 mg | ORAL_CAPSULE | Freq: Once | ORAL | Status: AC
  Administered 2018-11-21: 21:00:00 1000 mg via ORAL
  Filled 2018-11-21: qty 2

## 2018-11-21 MED ORDER — AMOXICILLIN 875 MG PO TABS: 875.0000 mg | ORAL_TABLET | Freq: Two times a day (BID) | ORAL | 0 refills | Status: DC

## 2018-11-21 NOTE — Discharge Instructions (Addendum)
Continue to alternate Tylenol and ibuprofen.  Take antibiotics as prescribed.  Return to ER for any difficulty swallowing, fevers above 101, worsening symptoms or urgent changes in health.

## 2018-11-21 NOTE — ED Triage Notes (Signed)
Pt to ER states sore throat, headache and ear pain since yesterday.  States vomited x 1 today.  Pt with fever here today, denies yesterday.  Pt states took Aleve this AM.

## 2018-11-21 NOTE — ED Provider Notes (Signed)
[place] EMERGENCY DEPARTMENT Provider Note   CSN: [id number] Arrival date & time: 11/21/18  1637     History   Chief Complaint Chief Complaint  Patient presents with  . Sore Throat  . Otalgia  . Headache    HPI Jesse Pugh is a 43 y.o. male presents the emergency department for evaluation of sore throat, earache.  Symptoms began 1 night ago.  Has had low-grade fever.  Patient states the sore throat pain is moderate.  No relief with Tylenol and ibuprofen.  He denies any cough congestion chest pain, shortness of breath abdominal pain nausea vomiting or diarrhea.  He denies any ear drainage or hearing loss.  No vision changes.     HPI  History reviewed. No pertinent past medical history.  There are no active problems to display for this patient.   History reviewed. No pertinent surgical history.      Home Medications    Prior to Admission medications   Medication Sig Start Date End Date Taking? Authorizing Provider  amoxicillin (AMOXIL) 875 MG tablet Take 1 tablet (875 mg total) by mouth 2 (two) times daily. X 10 days 11/21/18   Duanne Guess, PA-C  aspirin 325 MG tablet Take 650 mg by mouth every 6 (six) hours as needed for mild pain.    [provider]  ciprofloxacin (CILOXAN) 0.3 % ophthalmic ointment Apply one-half inch ribbon into lower eyelid 3 times daily for the first 2 days, then 2 times daily for the next 5 days 05/09/18   Johnn Hai, PA-C  HYDROcodone-acetaminophen (NORCO/VICODIN) 5-325 MG tablet Take 1 tablet by mouth every 6 (six) hours as needed for moderate pain. 05/09/18   Johnn Hai, PA-C  ibuprofen (ADVIL,MOTRIN) 600 MG tablet Take 1 tablet (600 mg total) by mouth every 8 (eight) hours as needed. 02/28/16   Ripley Fraise, MD    Family History History reviewed. No pertinent family history.  Social History Social History   Tobacco Use  . Smoking status: Current Every Day Smoker    Packs/day: 0.10     Types: Cigarettes  . Smokeless tobacco: Never Used  Substance Use Topics  . Alcohol use: Yes    Comment: occ  . Drug use: No     Allergies   Patient has no known allergies.   Review of Systems Review of Systems  Constitutional: Positive for fever.  HENT: Positive for ear pain and sore throat. Negative for ear discharge.   Respiratory: Negative for shortness of breath.   Cardiovascular: Negative for chest pain.  Gastrointestinal: Negative for diarrhea, nausea and vomiting.  Neurological: Negative for headaches.     Physical Exam Updated Vital Signs BP (!) 144/83 (BP Location: Right Arm)   Pulse (!) 103   Temp 98.7 F (37.1 C) (Oral)   Resp 20   Ht 6\' 2"  (1.88 m)   Wt 108.9 kg   SpO2 98%   BMI 30.81 kg/m   Physical Exam Constitutional:      Appearance: He is well-developed.  HENT:     Head: Normocephalic and atraumatic.     Right Ear: Tympanic membrane and ear canal normal. No drainage, swelling or tenderness. No middle ear effusion. Tympanic membrane is not erythematous.     Left Ear: Tympanic membrane and ear canal normal. No drainage, swelling or tenderness.  No middle ear effusion. Tympanic membrane is not erythematous.     Mouth/Throat:     Pharynx: Uvula midline. Oropharyngeal exudate and posterior  oropharyngeal erythema present. No uvula swelling.     Tonsils: Tonsillar exudate present. No tonsillar abscesses.  Eyes:     Conjunctiva/sclera: Conjunctivae normal.  Neck:     Musculoskeletal: Normal range of motion.  Cardiovascular:     Rate and Rhythm: Normal rate.  Pulmonary:     Effort: Pulmonary effort is normal. No respiratory distress.  Musculoskeletal: Normal range of motion.  Skin:    General: Skin is warm.     Findings: No rash.  Neurological:     Mental Status: He is alert and oriented to person, place, and time.  Psychiatric:        Behavior: Behavior normal.        Thought Content: Thought content normal.      ED Treatments / Results   Labs (all labs ordered are listed, but only abnormal results are displayed) Labs Reviewed  GROUP A STREP BY PCR    EKG None  Radiology No results found.  Procedures Procedures (including critical care time)  Medications Ordered in ED Medications  amoxicillin (AMOXIL) capsule 1,000 mg (has no administration in time range)  acetaminophen (TYLENOL) tablet 650 mg (650 mg Oral Given 11/21/18 1743)     Initial Impression / Assessment and Plan / ED Course  I have reviewed the triage vital signs and the nursing notes.  Pertinent labs & imaging results that were available during my care of the patient were reviewed by me and considered in my medical decision making (see chart for details).        43 year old male with pharyngitis, fever.  He has no cough.  Temperature responding to Tylenol and ibuprofen.  Tolerating p.o. well.  Although strep is negative due to exam findings and history will go ahead and treat for strep with amoxicillin.  He will continue with Tylenol and ibuprofen.  He understands signs symptoms return to ED for.  Final Clinical Impressions(s) / ED Diagnoses   Final diagnoses:  Pharyngitis due to Streptococcus species    ED Discharge Orders         Ordered    amoxicillin (AMOXIL) 875 MG tablet  2 times daily     11/21/18 2016           Ronnette JuniperGaines, Juvenal Umar C, PA-C 11/21/18 2019    Arnaldo NatalMalinda, Paul F, MD 11/22/18 [phone]

## 2018-11-21 NOTE — ED Notes (Signed)
See triage note. Pt states ear ache and fever began last night, sore throat this morning. Pt self medicating with OTC aleve at approx 1200. Pt states his 53 month old son was sick with a fever approx 2 weeks ago which has since resolved. Pt A&Ox4, NAD, no respiratory Sx noted.

## 2018-12-30 ENCOUNTER — Other Ambulatory Visit: Payer: Self-pay

## 2018-12-30 ENCOUNTER — Emergency Department
Admission: EM | Admit: 2018-12-30 | Discharge: 2018-12-30 | Disposition: A | Discharge status: Home / Self Care | Payer: Self-pay | Attending: Emergency Medicine | Admitting: Emergency Medicine

## 2018-12-30 ENCOUNTER — Encounter: Payer: Self-pay | Admitting: Emergency Medicine

## 2018-12-30 DIAGNOSIS — F17210 Nicotine dependence, cigarettes, uncomplicated: Secondary | ICD-10-CM | POA: Insufficient documentation

## 2018-12-30 DIAGNOSIS — L02414 Cutaneous abscess of left upper limb: Secondary | ICD-10-CM | POA: Insufficient documentation

## 2018-12-30 MED ORDER — HYDROCODONE-ACETAMINOPHEN 5-325 MG PO TABS: 1.0000 | ORAL_TABLET | Freq: Four times a day (QID) | ORAL | 0 refills | Status: AC | PRN

## 2018-12-30 MED ORDER — SULFAMETHOXAZOLE-TRIMETHOPRIM 800-160 MG PO TABS: 1.0000 | ORAL_TABLET | Freq: Two times a day (BID) | ORAL | 0 refills | Status: AC

## 2018-12-30 MED ORDER — HYDROCODONE-ACETAMINOPHEN 5-325 MG PO TABS
2.0000 | ORAL_TABLET | Freq: Once | ORAL | Status: AC
Start: 2018-12-30 — End: 2018-12-30
  Administered 2018-12-30: 2 via ORAL
  Filled 2018-12-30: qty 2

## 2018-12-30 NOTE — Discharge Instructions (Signed)
Begin applying warm compresses frequently to the abscess area.  Try to do this at least 4-5 times a day.  Begin taking antibiotics as directed twice a day until you have completely finished.  Also prescription for pain medication was sent to your pharmacy.  Do not drive or operate machinery while taking this medication.  Return to the emergency department in 2 days if it has not opened up on its own for I&D.

## 2018-12-30 NOTE — ED Provider Notes (Signed)
[facility] Emergency Department Provider Note  ____________________________________________   First MD Initiated Contact with Patient 12/30/18 1712     (approximate)  I have reviewed the triage vital signs and the nursing notes.   HISTORY  Chief Complaint Insect Bite   HPI Jesse Pugh is a 43 y.o. male presents to the ED with complaint of possible spider bite 2 days ago on his left shoulder.  Patient did not actually see [place] or spiders but has noticed that this area has increased slightly in size and has become painful.  Patient has not taken any over-the-counter medication or used any warm compresses in the area.  Currently rates his pain as a 10/10.      History reviewed. No pertinent past medical history.  There are no active problems to display for this patient.   History reviewed. No pertinent surgical history.  Prior to Admission medications   Medication Sig Start Date End Date Taking? Authorizing Provider  aspirin 325 MG tablet Take 650 mg by mouth every 6 (six) hours as needed for mild pain.    [provider]  ciprofloxacin (CILOXAN) 0.3 % ophthalmic ointment Apply one-half inch ribbon into lower eyelid 3 times daily for the first 2 days, then 2 times daily for the next 5 days 05/09/18   Johnn Hai, PA-C  HYDROcodone-acetaminophen (NORCO/VICODIN) 5-325 MG tablet Take 1-2 tablets by mouth every 6 (six) hours as needed for moderate pain. 12/30/18   Johnn Hai, PA-C  sulfamethoxazole-trimethoprim (BACTRIM DS) 800-160 MG tablet Take 1 tablet by mouth 2 (two) times daily. 12/30/18   Johnn Hai, PA-C    Allergies Patient has no known allergies.  No family history on file.  Social History Social History   Tobacco Use  . Smoking status: Current Every Day Smoker    Packs/day: 0.10    Types: Cigarettes  . Smokeless tobacco: Never Used  Substance Use Topics  . Alcohol use: Yes    Comment: occ  . Drug use: No     Review of Systems Constitutional: No fever/chills Cardiovascular: Denies chest pain. Respiratory: Denies shortness of breath. Gastrointestinal: No abdominal pain.  No nausea, no vomiting.  Musculoskeletal: Negative for muscle aches. Skin: Positive for possible insect bite/abscess. Neurological: Negative for headaches, focal weakness or numbness. ____________________________________________   PHYSICAL EXAM:  VITAL SIGNS: ED Triage Vitals  Enc Vitals Group     BP 12/30/18 1640 (!) 137/100     Pulse Rate 12/30/18 1640 90     Resp 12/30/18 1640 16     Temp 12/30/18 1640 98.5 F (36.9 C)     Temp Source 12/30/18 1640 Oral     SpO2 12/30/18 1640 97 %     Weight 12/30/18 1641 225 lb (102.1 kg)     Height 12/30/18 1641 6\' 2"  (1.88 m)     Head Circumference --      Peak Flow --      Pain Score 12/30/18 1640 10     Pain Loc --      Pain Edu? --      Excl. in [place]? --     Constitutional: Alert and oriented. Well appearing and in no acute distress. Eyes: Conjunctivae are normal.  Head: Atraumatic. Neck: No stridor.   Cardiovascular: Normal rate, regular rhythm. Grossly normal heart sounds.  Good peripheral circulation. Respiratory: Normal respiratory effort.  No retractions. Lungs CTAB. Musculoskeletal: No lower extremity tenderness nor edema.  No joint effusions. Neurologic:  Normal  speech and language. No gross focal neurologic deficits are appreciated. No gait instability. Skin:  Skin is warm, dry and intact.  On the left shoulder there is a moderately tender nonerythematous cystic area.  Area is nonfluctuant and slightly warm. Psychiatric: Mood and affect are normal. Speech and behavior are normal.  ____________________________________________   LABS (all labs ordered are listed, but only abnormal results are displayed)  Labs Reviewed - No data to display  PROCEDURES  Procedure(s) performed (including Critical Care):  Procedures    ____________________________________________   INITIAL IMPRESSION / ASSESSMENT AND PLAN / ED COURSE  As part of my medical decision making, I reviewed the following data within the electronic MEDICAL RECORD NUMBER Notes from prior ED visits and [place] Controlled Substance Database  43 year old male presents to the ED with complaint of a possible abscess to his left shoulder that started 2 days ago.  Area is warm and tender to palpation.  At this time it is non-erythematous and extremely firm to touch.  Patient was started on Bactrim DS twice daily for 10 days and Norco as needed for pain.  He is instructed to use warm compresses to the area frequently and to return to the emergency department and 2 days if IND is needed.  ____________________________________________   FINAL CLINICAL IMPRESSION(S) / ED DIAGNOSES  Final diagnoses:  Abscess of left shoulder     ED Discharge Orders         Ordered    sulfamethoxazole-trimethoprim (BACTRIM DS) 800-160 MG tablet  2 times daily     12/30/18 1732    HYDROcodone-acetaminophen (NORCO/VICODIN) 5-325 MG tablet  Every 6 hours PRN     12/30/18 1732           Note:  This document was prepared using Dragon voice recognition software and may include unintentional dictation errors.    Tommi RumpsSummers,  L, PA-C 12/30/18 1749    Chesley NoonJessup, Charles, MD 12/30/18 2023

## 2018-12-30 NOTE — ED Notes (Addendum)
Pt has swollen spot at top of L shoulder; noticed it about two days ago; thinks it is a bug or spider bite but did not see the bug/spider. Area started out small per pt but has inc in size. Is currently size of a quarter. Hot, raised, hard similar to an abcess. Pt denies fever at home. States area very painful.

## 2018-12-30 NOTE — ED Notes (Signed)
Reports 0/10 pain after medications. Vss. Patient discharged to home.

## 2018-12-30 NOTE — ED Triage Notes (Signed)
Patient states he noticed possible insect bite on left shoulder 2 days ago. Reports area is swollen and pain is getting worse. Large swollen knot noted on patient's left shoulder. No head or drainage noted to area.
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
## 2021-04-25 ENCOUNTER — Other Ambulatory Visit: Payer: Self-pay

## 2021-04-25 ENCOUNTER — Emergency Department
Admission: EM | Admit: 2021-04-25 | Discharge: 2021-04-25 | Disposition: A | Discharge status: Home / Self Care | Payer: Self-pay | Attending: Emergency Medicine | Admitting: Emergency Medicine

## 2021-04-25 ENCOUNTER — Encounter: Payer: Self-pay | Admitting: Emergency Medicine

## 2021-04-25 DIAGNOSIS — F17210 Nicotine dependence, cigarettes, uncomplicated: Secondary | ICD-10-CM | POA: Insufficient documentation

## 2021-04-25 DIAGNOSIS — Z20822 Contact with and (suspected) exposure to covid-19: Secondary | ICD-10-CM | POA: Insufficient documentation

## 2021-04-25 DIAGNOSIS — Z79899 Other long term (current) drug therapy: Secondary | ICD-10-CM | POA: Insufficient documentation

## 2021-04-25 DIAGNOSIS — J101 Influenza due to other identified influenza virus with other respiratory manifestations: Secondary | ICD-10-CM | POA: Insufficient documentation

## 2021-04-25 LAB — RESP PANEL BY RT-PCR (FLU A&B, COVID) ARPGX2
Influenza A by PCR: POSITIVE — AB
Influenza B by PCR: NEGATIVE
SARS Coronavirus 2 by RT PCR: NEGATIVE

## 2021-04-25 LAB — GROUP A STREP BY PCR: Group A Strep by PCR: NOT DETECTED

## 2021-04-25 MED ORDER — PREDNISONE 10 MG PO TABS: 40.0000 mg | ORAL_TABLET | Freq: Every day | ORAL | 0 refills | Status: AC

## 2021-04-25 MED ORDER — MAGIC MOUTHWASH W/LIDOCAINE: 5.0000 mL | Freq: Three times a day (TID) | ORAL | 0 refills | Status: AC

## 2021-04-25 NOTE — ED Triage Notes (Signed)
Pt via POV c/o sore throat, body aches, difficulty swallowing since Sunday night. Recent exposure to sick contacts at work. He has been taking OTC medications with minimal relief. No medication taken yet today. Pt reports productive cough with yellow/white sputum and bilateral rib pain associated with fits of coughing. He has vomited a few times over the past few days but not today.

## 2021-04-25 NOTE — ED Provider Notes (Signed)
[facility] Emergency Department Provider Note  ____________________________________________   Event Date/Time   First MD Initiated Contact with Patient 04/25/21 1255     (approximate)  I have reviewed the triage vital signs and the nursing notes.   HISTORY  Chief Complaint Sore Throat    HPI Jesse Pugh is a 45 y.o. male presents emergency department complaint of sore throat, body aches, difficulty swallowing since Sunday night.  Patient states he has had sick coworkers.  He has been taking over-the-counter medications without any relief.  Some cough.  Vomiting x2-3 but notes it.  No diarrhea  History reviewed. No pertinent past medical history.  There are no problems to display for this patient.   History reviewed. No pertinent surgical history.  Prior to Admission medications   Medication Sig Start Date End Date Taking? Authorizing Provider  magic mouthwash w/lidocaine SOLN Take 5 mLs by mouth 3 (three) times daily. 04/25/21  Yes Garrett Bowring, Roselyn Bering, PA-C  predniSONE (DELTASONE) 10 MG tablet Take 4 tablets (40 mg total) by mouth daily for 3 days. 04/25/21 04/28/21 Yes Jessamine Barcia, Roselyn Bering, PA-C  aspirin 325 MG tablet Take 650 mg by mouth every 6 (six) hours as needed for mild pain.    [provider]  ciprofloxacin (CILOXAN) 0.3 % ophthalmic ointment Apply one-half inch ribbon into lower eyelid 3 times daily for the first 2 days, then 2 times daily for the next 5 days 05/09/18   Tommi Rumps, PA-C  HYDROcodone-acetaminophen (NORCO/VICODIN) 5-325 MG tablet Take 1-2 tablets by mouth every 6 (six) hours as needed for moderate pain. 12/30/18   Tommi Rumps, PA-C  sulfamethoxazole-trimethoprim (BACTRIM DS) 800-160 MG tablet Take 1 tablet by mouth 2 (two) times daily. 12/30/18   Tommi Rumps, PA-C    Allergies Patient has no known allergies.  History reviewed. No pertinent family history.  Social History Social History   Tobacco Use    Smoking status: Every Day    Packs/day: 0.10    Types: Cigarettes   Smokeless tobacco: Never  Substance Use Topics   Alcohol use: Yes    Comment: occ   Drug use: No    Review of Systems  Constitutional: Positive fever/chills Eyes: No visual changes. ENT: Positive sore throat. Respiratory: Positive cough Cardiovascular: Denies chest pain Gastrointestinal: Denies abdominal pain Genitourinary: Negative for dysuria. Musculoskeletal: Negative for back pain. Skin: Negative for rash. Psychiatric: no mood changes,     ____________________________________________   PHYSICAL EXAM:  VITAL SIGNS: ED Triage Vitals  Enc Vitals Group     BP 04/25/21 1224 (!) 145/105     Pulse Rate 04/25/21 1224 100     Resp 04/25/21 1224 16     Temp 04/25/21 1224 98.3 F (36.8 C)     Temp Source 04/25/21 1224 Oral     SpO2 04/25/21 1224 95 %     Weight 04/25/21 1226 265 lb (120.2 kg)     Height 04/25/21 1226 6\' 2"  (1.88 m)     Head Circumference --      Peak Flow --      Pain Score 04/25/21 1226 10     Pain Loc --      Pain Edu? --      Excl. in [place]? --     Constitutional: Alert and oriented. Well appearing and in no acute distress. Eyes: Conjunctivae are normal.  Head: Atraumatic. Nose: No congestion/rhinnorhea. Mouth/Throat: Mucous membranes are moist.  Uvula is red and swollen Neck:  supple no lymphadenopathy noted Cardiovascular: Normal rate, regular rhythm. Heart sounds are normal Respiratory: Normal respiratory effort.  No retractions, lungs c t a  Abd: soft nontender bs normal all 4 quad GU: deferred Musculoskeletal: FROM all extremities, warm and well perfused Neurologic:  Normal speech and language.  Skin:  Skin is warm, dry and intact. No rash noted. Psychiatric: Mood and affect are normal. Speech and behavior are normal.  ____________________________________________   LABS (all labs ordered are listed, but only abnormal results are displayed)  Labs Reviewed  RESP  PANEL BY RT-PCR (FLU A&B, COVID) ARPGX2 - Abnormal; Notable for the following components:      Result Value   Influenza A by PCR POSITIVE (*)    All other components within normal limits  GROUP A STREP BY PCR   ____________________________________________   ____________________________________________  RADIOLOGY    ____________________________________________   PROCEDURES  Procedure(s) performed: No  Procedures    ____________________________________________   INITIAL IMPRESSION / ASSESSMENT AND PLAN / ED COURSE  Pertinent labs & imaging results that were available during my care of the patient were reviewed by me and considered in my medical decision making (see chart for details).   The patient is a 45 year old male who is unvaccinated for influenza and COVID presents emergency department with URI symptoms.  See HPI.  Physical exam shows patient appears stable.  Due to the URI symptoms we will do a respiratory panel, due to sore throat we will do a strep test  Respiratory panel is positive for influenza A, strep test is negative  I did explain the findings to the patient.  Explained to him that influenza is a virus and will just take time for him to get over.  He has been sick for too many days to start Tamiflu.  He was given a prescription for prednisone 40 mg daily for 3 days due to the swollen uvula, Magic mouthwash to gargle for his sore throat.  Drink plenty of fluids.  Tylenol and ibuprofen for fever as needed.  He is to return emergency department worsening.  See his regular doctor if not improved in 3 to 4 days.  Is given a work note stating has influenza and will need to stay out of work.  Discharged in stable condition.  Jesse Pugh was evaluated in Emergency Department on 04/25/2021 for the symptoms described in the history of present illness. He was evaluated in the context of the global COVID-19 pandemic, which necessitated consideration that the patient  might be at risk for infection with the SARS-CoV-2 virus that causes COVID-19. Institutional protocols and algorithms that pertain to the evaluation of patients at risk for COVID-19 are in a state of rapid change based on information released by regulatory bodies including the CDC and federal and state organizations. These policies and algorithms were followed during the patient's care in the ED.    As part of my medical decision making, I reviewed the following data within the electronic MEDICAL RECORD NUMBER Nursing notes reviewed and incorporated, Labs reviewed , Old chart reviewed, Notes from prior ED visits, and [place] Controlled Substance Database  ____________________________________________   FINAL CLINICAL IMPRESSION(S) / ED DIAGNOSES  Final diagnoses:  Influenza A      NEW MEDICATIONS STARTED DURING THIS VISIT:  New Prescriptions   MAGIC MOUTHWASH W/LIDOCAINE SOLN    Take 5 mLs by mouth 3 (three) times daily.   PREDNISONE (DELTASONE) 10 MG TABLET    Take 4 tablets (40 mg total) by  mouth daily for 3 days.     Note:  This document was prepared using Dragon voice recognition software and may include unintentional dictation errors.    Faythe Ghee, PA-C 04/25/21 1355    Delton [place]irie, MD 04/25/21 [phone]

## 2021-04-25 NOTE — Discharge Instructions (Signed)
Follow-up with your regular doctor if not improving to 3 days.  Return emergency department worsening.  Take your medication as prescribed
